# Patient Record
Sex: Male | Born: 1945 | Race: White | Hispanic: No | Marital: Married | Smoking: Never smoker
Health system: Southern US, Community
[De-identification: ages and names within clinical notes are randomized; demographics above are authoritative.]

## PROBLEM LIST (undated history)

## (undated) DIAGNOSIS — K439 Ventral hernia without obstruction or gangrene: Secondary | ICD-10-CM

## (undated) DIAGNOSIS — Z952 Presence of prosthetic heart valve: Secondary | ICD-10-CM

## (undated) DIAGNOSIS — M109 Gout, unspecified: Secondary | ICD-10-CM

## (undated) DIAGNOSIS — Z951 Presence of aortocoronary bypass graft: Secondary | ICD-10-CM

## (undated) DIAGNOSIS — H269 Unspecified cataract: Secondary | ICD-10-CM

## (undated) DIAGNOSIS — K219 Gastro-esophageal reflux disease without esophagitis: Secondary | ICD-10-CM

## (undated) DIAGNOSIS — I10 Essential (primary) hypertension: Secondary | ICD-10-CM

## (undated) DIAGNOSIS — M1990 Unspecified osteoarthritis, unspecified site: Secondary | ICD-10-CM

## (undated) DIAGNOSIS — L570 Actinic keratosis: Secondary | ICD-10-CM

## (undated) DIAGNOSIS — E785 Hyperlipidemia, unspecified: Secondary | ICD-10-CM

## (undated) DIAGNOSIS — I519 Heart disease, unspecified: Secondary | ICD-10-CM

## (undated) DIAGNOSIS — I4891 Unspecified atrial fibrillation: Secondary | ICD-10-CM

## (undated) DIAGNOSIS — I509 Heart failure, unspecified: Secondary | ICD-10-CM

## (undated) HISTORY — DX: Presence of prosthetic heart valve: Z95.2

## (undated) HISTORY — DX: Heart disease, unspecified: I51.9

## (undated) HISTORY — DX: Essential (primary) hypertension: I10

## (undated) HISTORY — DX: Presence of aortocoronary bypass graft: Z95.1

## (undated) HISTORY — DX: Actinic keratosis: L57.0

## (undated) HISTORY — PX: VALVE REPLACEMENT: SUR13

## (undated) HISTORY — PX: HERNIA REPAIR: SHX51

## (undated) HISTORY — DX: Hyperlipidemia, unspecified: E78.5

---

## 2015-12-12 ENCOUNTER — Encounter: Payer: Self-pay | Admitting: Dietician

## 2015-12-12 ENCOUNTER — Encounter: Payer: Medicare Other | Attending: Family Medicine | Admitting: Dietician

## 2015-12-12 VITALS — BP 120/72 | Ht 72.0 in | Wt 202.7 lb

## 2015-12-12 DIAGNOSIS — E119 Type 2 diabetes mellitus without complications: Secondary | ICD-10-CM | POA: Diagnosis present

## 2015-12-12 DIAGNOSIS — Z713 Dietary counseling and surveillance: Secondary | ICD-10-CM | POA: Diagnosis present

## 2015-12-12 NOTE — Progress Notes (Signed)
Diabetes Self-Management Education  Visit Type: First/Initial  Appt. Start Time: 0900 Appt. End Time: 1010  12/12/2015  [name], identified by name and date of birth, is a 70 y.o. male with a diagnosis of Diabetes: Type 2.   ASSESSMENT  Blood pressure 120/72, height 6' (1.829 m), weight 202 lb 11.2 oz (91.9 kg). Body mass index is 27.49 kg/m. Lacks knowledge of diabetes care and healthy diet     Diabetes Self-Management Education - 12/12/15 1115      Visit Information   Visit Type First/Initial     Initial Visit   Diabetes Type Type 2     Health Coping   How would you rate your overall health? Good     Psychosocial Assessment   Patient Belief/Attitude about Diabetes Motivated to manage diabetes   Self-care barriers None   Patient Concerns Monitoring;Medication;Glycemic Control;Weight Control;Healthy Lifestyle  prevent complications; become more fit; learn more about healthy diet   Special Needs None   Preferred Learning Style Auditory   Learning Readiness Ready     Pre-Education Assessment   Patient understands the diabetes disease and treatment process. Needs Instruction   Patient understands incorporating nutritional management into lifestyle. Needs Instruction   Patient undertands incorporating physical activity into lifestyle. Needs Instruction  exercise limited due to history of CABGx1 and aortic valve replacement   Patient understands using medications safely. Needs Instruction   Patient understands monitoring blood glucose, interpreting and using results Needs Instruction   Patient understands prevention, detection, and treatment of acute complications. Needs Instruction   Patient understands prevention, detection, and treatment of chronic complications. Needs Instruction   Patient understands how to develop strategies to address psychosocial issues. Needs Instruction   Patient understands how to develop strategies to promote health/change behavior.  Needs Instruction     Complications   Last HgB A1C per patient/outside source 6.7 %  11-14-15   How often do you check your blood sugar? 0 times/day (not testing)   Have you had a dilated eye exam in the past 12 months? No  2 years ago   Have you had a dental exam in the past 12 months? No  1 year ago   Are you checking your feet? No     Dietary Intake   Breakfast --  meal times vary-eats breakfast 6a-10a=egg or oatmeal   Snack (morning) --  eats occasional snack 10-11a=crackers or peanut butter Louis Cooper sandwich   Lunch --  occasionally skips lunch; eats 12-2p if does eat lunch=does not eat fried foods or sweets/desserts   Snack (afternoon) --  eats occasional snakc of crackers or peanut butter/jelly sandwich   Dinner --  eats supper at 5-7p=recently has cut back on pasta and carbs   Snack (evening) --  eats p-nut butter/jelly sandwich at 9-10p   Beverage(s) --  drinks fruit juice 2x/day, water 2-3x/day. decaf coffee with sugar 3-4x/day     Exercise   Exercise Type ADL's-lifts weights 30+ min 2-3x/wk plans to start [place] exercise program soon     Patient Education   Previous Diabetes Education No   Disease state  Explored patient's options for treatment of their diabetes;Definition of diabetes, type 1 and 2, and the diagnosis of diabetes   Nutrition management  Role of diet in the treatment of diabetes and the relationship between the three main macronutrients and blood glucose level;Food label reading, portion sizes and measuring food.;Carbohydrate counting   Physical activity and exercise  Role of exercise on diabetes management,  blood pressure control and cardiac health.  advised to get permission from MD before beginning any exercise program   Monitoring Taught/evaluated SMBG meter.;Purpose and frequency of SMBG.;Taught/discussed recording of test results and interpretation of SMBG.;Identified appropriate SMBG and/or A1C goals.;Yearly dilated eye exam  gave pt One Touch  Verio Flex meter and instructed on its use-BG 144 (2 hrpp)   Chronic complications Relationship between chronic complications and blood glucose control;Retinopathy and reason for yearly dilated eye exams;Dental care   Personal strategies to promote health Lifestyle issues that need to be addressed for better diabetes care;Helped patient develop diabetes management plan for (enter comment)      Individualized Plan for Diabetes Self-Management Training:   Learning Objective:  Patient will have a greater understanding of diabetes self-management. Patient education plan is to attend individual and/or group sessions per assessed needs and concerns.   Plan:   Patient Instructions   Check blood sugars 2 x day before breakfast and 2 hrs after supper every day and record  Exercise:  Radiation protection practitioner -get MD permission  Avoid sugar sweetened drinks (soda, tea, coffee, sports drinks, juices)  Eat 3 meals day,   1 snacks a day at bedtime  Space meals 4-6 hours apart  Eat 3-4 carbohydrate servings/meal + protein  Eat 1 carbohydrate serving/snack + protein  Make dentist / eye doctor appointments  Bring blood sugar records to the next appointment/class  Call your doctor for a prescription for:  1. Meter strips (type)  Ultra One Touch Verio test strips  checking 2 times per day  2. Lancets (type)  One Touch Delica lancets  checking 2  times per day  Get a Sharps container  Return for appointment/classes on: 01-01-16   Expected Outcomes:   positive  Education material provided: general meal planning guidelines, Ultra One Touch Verio Flex meter  If problems or questions, patient to contact team via: [phone]  Future DSME appointment:  01-01-16

## 2015-12-12 NOTE — Patient Instructions (Signed)
  Check blood sugars 2 x day before breakfast and 2 hrs after supper every day and record  Exercise:  Radiation protection practitioner -get MD permission  Avoid sugar sweetened drinks (soda, tea, coffee, sports drinks, juices)  Eat 3 meals day,   1 snacks a day at bedtime  Space meals 4-6 hours apart  Eat 3-4 carbohydrate servings/meal + protein  Eat 1 carbohydrate serving/snack + protein  Make dentist / eye doctor appointments  Bring blood sugar records to the next appointment/class  Call your doctor for a prescription for:  1. Meter strips (type)  Ultra One Touch Verio test strips  checking 2 times per day  2. Lancets (type)  One Touch Delica lancets  checking 2  times per day  Get a Sharps container  Return for appointment/classes on: 01-01-16

## 2015-12-15 ENCOUNTER — Telehealth: Payer: Self-pay | Admitting: *Deleted

## 2015-12-15 NOTE — Telephone Encounter (Signed)
Patient left voice mail that he was given a One Touch Verio meter when he met with Lenell Antu, RN earlier this week. His physician wrote for One Touch Mini meter and he requested assistance using it. I left a message on his voice mail to call back.

## 2016-01-01 ENCOUNTER — Encounter: Payer: Self-pay | Admitting: Dietician

## 2016-01-01 ENCOUNTER — Encounter: Payer: Medicare Other | Attending: Family Medicine | Admitting: Dietician

## 2016-01-01 VITALS — Ht 72.0 in | Wt 200.8 lb

## 2016-01-01 DIAGNOSIS — Z713 Dietary counseling and surveillance: Secondary | ICD-10-CM | POA: Insufficient documentation

## 2016-01-01 DIAGNOSIS — E119 Type 2 diabetes mellitus without complications: Secondary | ICD-10-CM | POA: Diagnosis present

## 2016-01-01 NOTE — Progress Notes (Signed)

## 2016-01-08 ENCOUNTER — Encounter: Payer: Self-pay | Admitting: Dietician

## 2016-01-08 ENCOUNTER — Encounter: Payer: Medicare Other | Admitting: Dietician

## 2016-01-08 VITALS — Wt 200.0 lb

## 2016-01-08 DIAGNOSIS — Z713 Dietary counseling and surveillance: Secondary | ICD-10-CM | POA: Diagnosis not present

## 2016-01-08 DIAGNOSIS — E119 Type 2 diabetes mellitus without complications: Secondary | ICD-10-CM

## 2016-01-08 NOTE — Progress Notes (Signed)

## 2016-03-11 ENCOUNTER — Encounter: Payer: Self-pay | Admitting: Dietician

## 2016-03-11 ENCOUNTER — Encounter: Payer: Medicare Other | Attending: Family Medicine | Admitting: Dietician

## 2016-03-11 VITALS — BP 130/80 | Ht 72.0 in | Wt 198.5 lb

## 2016-03-11 DIAGNOSIS — E119 Type 2 diabetes mellitus without complications: Secondary | ICD-10-CM | POA: Diagnosis present

## 2016-03-11 DIAGNOSIS — Z713 Dietary counseling and surveillance: Secondary | ICD-10-CM | POA: Insufficient documentation

## 2016-03-11 NOTE — Progress Notes (Signed)

## 2016-03-20 ENCOUNTER — Encounter: Payer: Self-pay | Admitting: *Deleted

## 2016-03-20 NOTE — Progress Notes (Signed)
Chart review.

## 2016-04-02 ENCOUNTER — Other Ambulatory Visit: Payer: Self-pay

## 2016-04-02 ENCOUNTER — Ambulatory Visit
Admission: RE | Admit: 2016-04-02 | Discharge: 2016-04-02 | Disposition: A | Discharge status: Home / Self Care | Payer: Medicare Other | Source: Ambulatory Visit | Attending: Internal Medicine | Admitting: Internal Medicine

## 2016-04-02 ENCOUNTER — Encounter: Payer: Self-pay | Admitting: *Deleted

## 2016-04-02 ENCOUNTER — Encounter: Payer: Self-pay | Admitting: Dietician

## 2016-04-02 ENCOUNTER — Encounter
Admission: RE | Disposition: A | Discharge status: Home / Self Care | Payer: Self-pay | Source: Ambulatory Visit | Attending: Internal Medicine

## 2016-04-02 ENCOUNTER — Ambulatory Visit: Payer: Medicare Other | Admitting: Anesthesiology

## 2016-04-02 DIAGNOSIS — Z7982 Long term (current) use of aspirin: Secondary | ICD-10-CM | POA: Diagnosis not present

## 2016-04-02 DIAGNOSIS — I110 Hypertensive heart disease with heart failure: Secondary | ICD-10-CM | POA: Diagnosis not present

## 2016-04-02 DIAGNOSIS — I509 Heart failure, unspecified: Secondary | ICD-10-CM | POA: Diagnosis not present

## 2016-04-02 DIAGNOSIS — I480 Paroxysmal atrial fibrillation: Secondary | ICD-10-CM | POA: Diagnosis not present

## 2016-04-02 DIAGNOSIS — E785 Hyperlipidemia, unspecified: Secondary | ICD-10-CM | POA: Insufficient documentation

## 2016-04-02 DIAGNOSIS — Z7901 Long term (current) use of anticoagulants: Secondary | ICD-10-CM | POA: Diagnosis not present

## 2016-04-02 DIAGNOSIS — Z952 Presence of prosthetic heart valve: Secondary | ICD-10-CM | POA: Insufficient documentation

## 2016-04-02 DIAGNOSIS — Z951 Presence of aortocoronary bypass graft: Secondary | ICD-10-CM | POA: Insufficient documentation

## 2016-04-02 DIAGNOSIS — I4891 Unspecified atrial fibrillation: Secondary | ICD-10-CM | POA: Diagnosis present

## 2016-04-02 DIAGNOSIS — Z79899 Other long term (current) drug therapy: Secondary | ICD-10-CM | POA: Diagnosis not present

## 2016-04-02 HISTORY — DX: Gastro-esophageal reflux disease without esophagitis: K21.9

## 2016-04-02 HISTORY — PX: TEE WITHOUT CARDIOVERSION: SHX5443

## 2016-04-02 HISTORY — PX: CARDIOVERSION: EP1203

## 2016-04-02 HISTORY — DX: Unspecified osteoarthritis, unspecified site: M19.90

## 2016-04-02 HISTORY — DX: Unspecified cataract: H26.9

## 2016-04-02 HISTORY — DX: Gout, unspecified: M10.9

## 2016-04-02 HISTORY — DX: Unspecified atrial fibrillation: I48.91

## 2016-04-02 HISTORY — DX: Heart failure, unspecified: I50.9

## 2016-04-02 HISTORY — DX: Ventral hernia without obstruction or gangrene: K43.9

## 2016-04-02 LAB — PROTIME-INR
INR: 1.96
PROTHROMBIN TIME: 22.6 s — AB (ref 11.4–15.2)

## 2016-04-02 SURGERY — ECHOCARDIOGRAM, TRANSESOPHAGEAL
Anesthesia: General

## 2016-04-02 MED ORDER — MIDAZOLAM HCL 2 MG/2ML IJ SOLN
INTRAMUSCULAR | Status: DC | PRN
  Administered 2016-04-02: 1.5 mg via INTRAVENOUS

## 2016-04-02 MED ORDER — SODIUM CHLORIDE 0.9 % IV SOLN: INTRAVENOUS | Status: DC

## 2016-04-02 MED ORDER — PROPOFOL 10 MG/ML IV BOLUS
INTRAVENOUS | Status: DC | PRN
  Administered 2016-04-02 (×2): 10 mg via INTRAVENOUS
  Administered 2016-04-02: 20 mg via INTRAVENOUS
  Administered 2016-04-02: 10 mg via INTRAVENOUS
  Administered 2016-04-02: 20 mg via INTRAVENOUS
  Administered 2016-04-02: 10 mg via INTRAVENOUS
  Administered 2016-04-02 (×4): 20 mg via INTRAVENOUS
  Administered 2016-04-02: 10 mg via INTRAVENOUS
  Administered 2016-04-02: 20 mg via INTRAVENOUS

## 2016-04-02 MED ORDER — SODIUM CHLORIDE FLUSH 0.9 % IV SOLN
INTRAVENOUS | Status: AC
  Filled 2016-04-02: qty 10

## 2016-04-02 MED ORDER — MIDAZOLAM HCL 2 MG/2ML IJ SOLN
INTRAMUSCULAR | Status: AC
  Filled 2016-04-02: qty 2

## 2016-04-02 MED ORDER — LIDOCAINE VISCOUS 2 % MT SOLN
OROMUCOSAL | Status: AC
  Filled 2016-04-02: qty 15

## 2016-04-02 MED ORDER — ONDANSETRON HCL 4 MG/2ML IJ SOLN: 4.0000 mg | Freq: Once | INTRAMUSCULAR | Status: DC | PRN

## 2016-04-02 MED ORDER — PROPOFOL 10 MG/ML IV BOLUS
INTRAVENOUS | Status: AC
  Filled 2016-04-02: qty 40

## 2016-04-02 MED ORDER — BUTAMBEN-TETRACAINE-BENZOCAINE 2-2-14 % EX AERO
INHALATION_SPRAY | CUTANEOUS | Status: AC
  Filled 2016-04-02: qty 20

## 2016-04-02 NOTE — CV Procedure (Signed)
Electrical Cardioversion Procedure Note Louis Cooper [id number] 1945-06-25  Procedure: Electrical Cardioversion Indications:  Atrial Fibrillation  Procedure Details Consent: Risks of procedure as well as the alternatives and risks of each were explained to the (patient/caregiver).  Consent for procedure obtained. Time Out: Verified patient identification, verified procedure, site/side was marked, verified correct patient position, special equipment/implants available, medications/allergies/relevent history reviewed, required imaging and test results available.  Performed  Patient placed on cardiac monitor, pulse oximetry, supplemental oxygen as necessary.  Sedation given: Benzodiazepines and Short-acting barbiturates Pacer pads placed anterior and posterior chest.  Cardioverted 1 time(s).  Cardioverted at 120J.  Evaluation Findings: Post procedure EKG shows: NSR Complications: None Patient did tolerate procedure well.   Corey Skains 04/02/2016, 7:41 AM

## 2016-04-02 NOTE — Transfer of Care (Signed)
Immediate Anesthesia Transfer of Care Note  Patient: Louis Cooper  Procedure(s) Performed: Procedure(s): TRANSESOPHAGEAL ECHOCARDIOGRAM (TEE) (N/A) CARDIOVERSION (N/A)  Patient Location: PACU  Anesthesia Type:General  Level of Consciousness: awake and patient cooperative  Airway & Oxygen Therapy: Patient Spontanous Breathing and Patient connected to nasal cannula oxygen  Post-op Assessment: Report given to RN and Post -op Vital signs reviewed and stable  Post vital signs: Reviewed and stable  Last Vitals:  Vitals:   04/02/16 0643 04/02/16 0744  BP: 135/85 99/69  Pulse: 91 73  Resp: 17   Temp: 36.6 C     Last Pain:  Vitals:   04/02/16 0643  TempSrc: Oral         Complications: No apparent anesthesia complications

## 2016-04-02 NOTE — Anesthesia Post-op Follow-up Note (Cosign Needed)
Anesthesia QCDR form completed.        

## 2016-04-02 NOTE — Anesthesia Preprocedure Evaluation (Signed)
Anesthesia Evaluation  Patient identified by MRN, date of birth, ID band Patient awake    Reviewed: Allergy & Precautions, H&P , NPO status , Patient's Chart, lab work & pertinent test results, reviewed documented beta blocker date and time   Airway Mallampati: II   Neck ROM: full    Dental  (+) Poor Dentition, Teeth Intact   Pulmonary neg pulmonary ROS,    Pulmonary exam normal        Cardiovascular hypertension, On Medications +CHF  negative cardio ROS Normal cardiovascular exam+ dysrhythmias Atrial Fibrillation  Rhythm:regular Rate:Normal     Neuro/Psych negative neurological ROS  negative psych ROS   GI/Hepatic negative GI ROS, Neg liver ROS, GERD  Medicated,  Endo/Other  negative endocrine ROS  Renal/GU negative Renal ROS  negative genitourinary   Musculoskeletal   Abdominal   Peds  Hematology negative hematology ROS (+)   Anesthesia Other Findings Past Medical History: No date: A-fib ([place]) No date: Arthritis No date: Cataract No date: CHF (congestive heart failure) (HCC) No date: GERD (gastroesophageal reflux disease) No date: Gout No date: Heart disease No date: Hernia of abdominal wall No date: History of heart valve replacement 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 No date: Hyperlipidemia No date: Hyperlipidemia No date: Hypertension Past Surgical History: No date: HERNIA REPAIR No date: VALVE REPLACEMENT BMI    Body Mass Index:  27.34 kg/m     Reproductive/Obstetrics negative OB ROS                             Anesthesia Physical Anesthesia Plan  ASA: III  Anesthesia Plan: General   Post-op Pain Management:    Induction:   Airway Management Planned:   Additional Equipment:   Intra-op Plan:   Post-operative Plan:   Informed Consent: I have reviewed the patients History and Physical, chart, labs and discussed the procedure including the risks, benefits and alternatives  for the proposed anesthesia with the patient or authorized representative who has indicated his/her understanding and acceptance.   Dental Advisory Given  Plan Discussed with: CRNA  Anesthesia Plan Comments:         Anesthesia Quick Evaluation

## 2016-04-02 NOTE — Progress Notes (Signed)
*  PRELIMINARY RESULTS* Echocardiogram Echocardiogram Transesophageal has been performed.  Sherrie Sport 04/02/2016, 8:19 AM

## 2016-04-03 ENCOUNTER — Encounter: Payer: Self-pay | Admitting: Internal Medicine

## 2016-04-04 NOTE — Anesthesia Postprocedure Evaluation (Signed)
Anesthesia Post Note  Patient: Verne [place]  Procedure(s) Performed: Procedure(s) (LRB): TRANSESOPHAGEAL ECHOCARDIOGRAM (TEE) (N/A) CARDIOVERSION (N/A)  Patient location during evaluation: PACU Anesthesia Type: General Level of consciousness: awake and alert Pain management: pain level controlled Vital Signs Assessment: post-procedure vital signs reviewed and stable Respiratory status: spontaneous breathing, nonlabored ventilation, respiratory function stable and patient connected to nasal cannula oxygen Cardiovascular status: blood pressure returned to baseline and stable Postop Assessment: no signs of nausea or vomiting Anesthetic complications: no     Last Vitals:  Vitals:   04/02/16 0830 04/02/16 0845  BP: 120/77 128/77  Pulse: 68 72  Resp: 16 12  Temp:      Last Pain:  Vitals:   04/02/16 0643  TempSrc: Oral                 Molli Barrows

## 2016-04-25 ENCOUNTER — Encounter: Payer: Self-pay | Admitting: Intensive Care

## 2016-04-25 ENCOUNTER — Observation Stay
Admission: EM | Admit: 2016-04-25 | Discharge: 2016-04-26 | Disposition: A | Discharge status: Home / Self Care | Payer: Medicare Other | Attending: Internal Medicine | Admitting: Internal Medicine

## 2016-04-25 ENCOUNTER — Other Ambulatory Visit: Payer: Self-pay

## 2016-04-25 ENCOUNTER — Emergency Department: Payer: Medicare Other

## 2016-04-25 DIAGNOSIS — Z79899 Other long term (current) drug therapy: Secondary | ICD-10-CM | POA: Insufficient documentation

## 2016-04-25 DIAGNOSIS — I509 Heart failure, unspecified: Secondary | ICD-10-CM | POA: Diagnosis not present

## 2016-04-25 DIAGNOSIS — Z7901 Long term (current) use of anticoagulants: Secondary | ICD-10-CM | POA: Diagnosis not present

## 2016-04-25 DIAGNOSIS — I441 Atrioventricular block, second degree: Principal | ICD-10-CM | POA: Insufficient documentation

## 2016-04-25 DIAGNOSIS — Z952 Presence of prosthetic heart valve: Secondary | ICD-10-CM | POA: Diagnosis not present

## 2016-04-25 DIAGNOSIS — Z951 Presence of aortocoronary bypass graft: Secondary | ICD-10-CM | POA: Insufficient documentation

## 2016-04-25 DIAGNOSIS — R55 Syncope and collapse: Secondary | ICD-10-CM | POA: Diagnosis present

## 2016-04-25 DIAGNOSIS — I482 Chronic atrial fibrillation: Secondary | ICD-10-CM | POA: Insufficient documentation

## 2016-04-25 DIAGNOSIS — I110 Hypertensive heart disease with heart failure: Secondary | ICD-10-CM | POA: Insufficient documentation

## 2016-04-25 DIAGNOSIS — I480 Paroxysmal atrial fibrillation: Secondary | ICD-10-CM | POA: Insufficient documentation

## 2016-04-25 DIAGNOSIS — I25810 Atherosclerosis of coronary artery bypass graft(s) without angina pectoris: Secondary | ICD-10-CM | POA: Diagnosis not present

## 2016-04-25 DIAGNOSIS — Z7982 Long term (current) use of aspirin: Secondary | ICD-10-CM | POA: Insufficient documentation

## 2016-04-25 DIAGNOSIS — E785 Hyperlipidemia, unspecified: Secondary | ICD-10-CM | POA: Insufficient documentation

## 2016-04-25 DIAGNOSIS — I459 Conduction disorder, unspecified: Secondary | ICD-10-CM | POA: Diagnosis present

## 2016-04-25 LAB — URINALYSIS, COMPLETE (UACMP) WITH MICROSCOPIC
BACTERIA UA: NONE SEEN
BILIRUBIN URINE: NEGATIVE
Glucose, UA: NEGATIVE mg/dL
HGB URINE DIPSTICK: NEGATIVE
Ketones, ur: NEGATIVE mg/dL
LEUKOCYTES UA: NEGATIVE
Nitrite: NEGATIVE
Protein, ur: NEGATIVE mg/dL
Specific Gravity, Urine: 1.013 (ref 1.005–1.030)
pH: 6 (ref 5.0–8.0)

## 2016-04-25 LAB — CBC
HCT: 38 % — ABNORMAL LOW (ref 40.0–52.0)
HEMOGLOBIN: 13.2 g/dL (ref 13.0–18.0)
MCH: 29.6 pg (ref 26.0–34.0)
MCHC: 34.7 g/dL (ref 32.0–36.0)
MCV: 85.5 fL (ref 80.0–100.0)
Platelets: 156 10*3/uL (ref 150–440)
RBC: 4.45 MIL/uL (ref 4.40–5.90)
RDW: 13.7 % (ref 11.5–14.5)
WBC: 4.8 10*3/uL (ref 3.8–10.6)

## 2016-04-25 LAB — BASIC METABOLIC PANEL
ANION GAP: 7 (ref 5–15)
BUN: 18 mg/dL (ref 6–20)
CHLORIDE: 107 mmol/L (ref 101–111)
CO2: 25 mmol/L (ref 22–32)
CREATININE: 0.8 mg/dL (ref 0.61–1.24)
Calcium: 9 mg/dL (ref 8.9–10.3)
GFR calc non Af Amer: >60 mL/min (ref >60)
Glucose, Bld: 117 mg/dL — ABNORMAL HIGH (ref 65–99)
POTASSIUM: 3.8 mmol/L (ref 3.5–5.1)
Sodium: 139 mmol/L (ref 135–145)

## 2016-04-25 LAB — PROTIME-INR
INR: 1.23
PROTHROMBIN TIME: 15.6 s — AB (ref 11.4–15.2)

## 2016-04-25 LAB — APTT: aPTT: 39 seconds — ABNORMAL HIGH (ref 24–36)

## 2016-04-25 MED ORDER — WARFARIN SODIUM 5 MG PO TABS: 5.0000 mg | ORAL_TABLET | Freq: Every evening | ORAL | Status: DC

## 2016-04-25 MED ORDER — TAMSULOSIN HCL 0.4 MG PO CAPS
0.4000 mg | ORAL_CAPSULE | Freq: Every day | ORAL | Status: DC
  Administered 2016-04-25: 0.4 mg via ORAL
  Filled 2016-04-25: qty 1

## 2016-04-25 MED ORDER — ACETAMINOPHEN 325 MG PO TABS: 650.0000 mg | ORAL_TABLET | Freq: Four times a day (QID) | ORAL | Status: DC | PRN

## 2016-04-25 MED ORDER — COENZYME Q10 100 MG PO CAPS: 1.0000 | ORAL_CAPSULE | Freq: Every day | ORAL | Status: DC

## 2016-04-25 MED ORDER — ATORVASTATIN CALCIUM 20 MG PO TABS
40.0000 mg | ORAL_TABLET | Freq: Every day | ORAL | Status: DC
  Administered 2016-04-25: 40 mg via ORAL
  Filled 2016-04-25: qty 2

## 2016-04-25 MED ORDER — WARFARIN SODIUM 3 MG PO TABS
6.0000 mg | ORAL_TABLET | Freq: Once | ORAL | Status: AC
  Administered 2016-04-25: 6 mg via ORAL
  Filled 2016-04-25: qty 1
  Filled 2016-04-25: qty 2

## 2016-04-25 MED ORDER — SODIUM CHLORIDE 0.9 % IV BOLUS (SEPSIS)
1000.0000 mL | Freq: Once | INTRAVENOUS | Status: AC
  Administered 2016-04-25: 1000 mL via INTRAVENOUS

## 2016-04-25 MED ORDER — ONDANSETRON HCL 4 MG PO TABS: 4.0000 mg | ORAL_TABLET | Freq: Four times a day (QID) | ORAL | Status: DC | PRN

## 2016-04-25 MED ORDER — WARFARIN - PHARMACIST DOSING INPATIENT
Freq: Every day | Status: DC
  Administered 2016-04-25: 1
  Filled 2016-04-25 (×7): qty 1

## 2016-04-25 MED ORDER — VITAMIN D 1000 UNITS PO TABS
1000.0000 [IU] | ORAL_TABLET | Freq: Every day | ORAL | Status: DC
  Administered 2016-04-25 – 2016-04-26 (×2): 1000 [IU] via ORAL
  Filled 2016-04-25 (×4): qty 1

## 2016-04-25 MED ORDER — RENA-VITE PO TABS
1.0000 | ORAL_TABLET | Freq: Every day | ORAL | Status: DC
  Administered 2016-04-25 – 2016-04-26 (×2): 1 via ORAL
  Filled 2016-04-25 (×2): qty 1

## 2016-04-25 MED ORDER — MAGNESIUM OXIDE 400 (241.3 MG) MG PO TABS
400.0000 mg | ORAL_TABLET | Freq: Every day | ORAL | Status: DC
  Administered 2016-04-25 – 2016-04-26 (×2): 400 mg via ORAL
  Filled 2016-04-25 (×2): qty 1

## 2016-04-25 MED ORDER — ONDANSETRON HCL 4 MG/2ML IJ SOLN: 4.0000 mg | Freq: Four times a day (QID) | INTRAMUSCULAR | Status: DC | PRN

## 2016-04-25 MED ORDER — ASPIRIN EC 81 MG PO TBEC
81.0000 mg | DELAYED_RELEASE_TABLET | Freq: Every day | ORAL | Status: DC
  Administered 2016-04-25 – 2016-04-26 (×2): 81 mg via ORAL
  Filled 2016-04-25 (×2): qty 1

## 2016-04-25 MED ORDER — ACETAMINOPHEN 650 MG RE SUPP: 650.0000 mg | Freq: Four times a day (QID) | RECTAL | Status: DC | PRN

## 2016-04-25 NOTE — H&P (Signed)
[place] [facility] [place] NAME: Louis Cooper    MR#:  [id number]  DATE OF BIRTH:  [birth year]  DATE OF ADMISSION:  04/25/2016  PRIMARY CARE PHYSICIAN: Maryland Pink, MD   REQUESTING/REFERRING PHYSICIAN: Dr Mable Paris  CHIEF COMPLAINT:  Dizziness and not feeling well near-syncopal episode  HISTORY OF PRESENT ILLNESS:  Louis Cooper  is a 71 y.o. male with a known history of Severe aortic stenosis status post aortic valve replacement, coronary artery disease status post CABG, history of A. fib on oral anticoagulation comes in the emergency room after he was going home from chiropractor office for his patient's wife's appointment started having dizziness and not feeling well. He had a near-syncopal episode while he was in the car. He came to the emergency room was found to be bradycardic initially with possible  second-degree Mobitz 2. Patient takes multach and beta blockers which will be held.  Patient's heart rate during my evaluation is in the 70s. He is asymptomatic. Presently. Denies any chest pain or shortness of breath  PAST MEDICAL HISTORY:   Past Medical History:  Diagnosis Date  . A-fib ([place])   . Arthritis   . Cataract   . CHF (congestive heart failure) ([place])   . GERD (gastroesophageal reflux disease)   . Gout   . Heart disease   . Hernia of abdominal wall   . History of heart valve replacement   . Hx of CABG   . Hyperlipidemia   . Hyperlipidemia   . Hypertension     PAST SURGICAL HISTOIRY:   Past Surgical History:  Procedure Laterality Date  . CARDIOVERSION N/A 04/02/2016   Procedure: CARDIOVERSION;  Surgeon: Corey Skains, MD;  Location: [facility] ORS;  Service: Cardiovascular;  Laterality: N/A;  . HERNIA REPAIR    . TEE WITHOUT CARDIOVERSION N/A 04/02/2016   Procedure: TRANSESOPHAGEAL ECHOCARDIOGRAM (TEE);  Surgeon: Corey Skains, MD;  Location: [facility] ORS;  Service: Cardiovascular;  Laterality: N/A;  . VALVE  REPLACEMENT      SOCIAL HISTORY:   Social History  Substance Use Topics  . Smoking status: Never Smoker  . Smokeless tobacco: Never Used  . Alcohol use No    FAMILY HISTORY:  History reviewed. No pertinent family history.  DRUG ALLERGIES:  No Known Allergies  REVIEW OF SYSTEMS:  Review of Systems  Constitutional: Negative for chills, fever and weight loss.  HENT: Negative for ear discharge, ear pain and nosebleeds.   Eyes: Negative for blurred vision, pain and discharge.  Respiratory: Negative for sputum production, shortness of breath, wheezing and stridor.   Cardiovascular: Negative for chest pain, palpitations, orthopnea and PND.  Gastrointestinal: Negative for abdominal pain, diarrhea, nausea and vomiting.  Genitourinary: Negative for frequency and urgency.  Musculoskeletal: Negative for back pain and joint pain.  Neurological: Positive for dizziness and weakness. Negative for sensory change, speech change and focal weakness.  Psychiatric/Behavioral: Negative for depression and hallucinations. The patient is not nervous/anxious.      MEDICATIONS AT HOME:   Prior to Admission medications   Medication Sig Start Date End Date Taking? Authorizing Provider  atorvastatin (LIPITOR) 40 MG tablet Take 1 tablet by mouth daily. 09/25/15  Yes Historical Provider, MD  B Complex Vitamins (VITAMIN B COMPLEX) TABS Take 1 tablet by mouth daily.   Yes Historical Provider, MD  cetaphil (CETAPHIL) cream Apply 1 application topically as needed (moisture).   Yes Historical Provider, MD  cholecalciferol (VITAMIN D) 1000 units tablet Take 1,000  Units by mouth daily.    Yes Historical Provider, MD  Coenzyme Q10 100 MG capsule Take 1 capsule by mouth daily.   Yes Historical Provider, MD  Magnesium 400 MG TABS Take 1 tablet by mouth daily.   Yes Historical Provider, MD  tamsulosin (FLOMAX) 0.4 MG CAPS capsule Take 0.4 mg by mouth daily.    Yes Historical Provider, MD  TOPROL XL 50 MG 24 hr tablet  Take 50 mg by mouth daily. 04/10/16  Yes Historical Provider, MD  triamcinolone cream (KENALOG) 0.1 % Apply 1 application topically daily as needed (rash).   Yes Historical Provider, MD  warfarin (COUMADIN) 5 MG tablet Take 5 mg by mouth every evening. 03/19/16  Yes Historical Provider, MD  aspirin EC 81 MG tablet Take 1 tablet by mouth daily.    Historical Provider, MD      VITAL SIGNS:  Blood pressure (!) 168/82, pulse 67, temperature 97.5 F (36.4 C), temperature source Oral, resp. rate 11, height 6' (1.829 m), weight 86.2 kg (190 lb), SpO2 96 %.  PHYSICAL EXAMINATION:  GENERAL:  71 y.o.-year-old patient lying in the bed with no acute distress.  EYES: Pupils equal, round, reactive to light and accommodation. No scleral icterus. Extraocular muscles intact.  HEENT: Head atraumatic, normocephalic. Oropharynx and nasopharynx clear.  NECK:  Supple, no jugular venous distention. No thyroid enlargement, no tenderness.  LUNGS: Normal breath sounds bilaterally, no wheezing, rales,rhonchi or crepitation. No use of accessory muscles of respiration.  CARDIOVASCULAR: S1, S2 normal.systolic  Murmurs in the aortic area ++,no  rubs, or gallops.  ABDOMEN: Soft, nontender, nondistended. Bowel sounds present. No organomegaly or mass.  EXTREMITIES: No pedal edema, cyanosis, or clubbing.  NEUROLOGIC: Cranial nerves II through XII are intact. Muscle strength 5/5 in all extremities. Sensation intact. Gait not checked.  PSYCHIATRIC: The patient is alert and oriented x 3.  SKIN: No obvious rash, lesion, or ulcer.   LABORATORY PANEL:   CBC  Recent Labs Lab 04/25/16 1200  WBC 4.8  HGB 13.2  HCT 38.0*  PLT 156   ------------------------------------------------------------------------------------------------------------------  Chemistries   Recent Labs Lab 04/25/16 1200  NA 139  K 3.8  CL 107  CO2 25  GLUCOSE 117*  BUN 18  CREATININE 0.80  CALCIUM 9.0    ------------------------------------------------------------------------------------------------------------------  Cardiac Enzymes No results for input(s): TROPONINI in the last 168 hours. ------------------------------------------------------------------------------------------------------------------  RADIOLOGY:  Dg Chest Port 1 View  Result Date: 04/25/2016 CLINICAL DATA:  Dizziness after chiropractor visit. History of hypertension, hyperlipidemia, CHF. EXAM: PORTABLE CHEST 1 VIEW COMPARISON:  None. FINDINGS: Cardiac silhouette is mild-to-moderately enlarged. Status post CABG. Increased lung volumes with mild chronic interstitial changes, no pleural effusion or focal consolidation. Apical pleural thickening. No pneumothorax. Osteopenia. Soft tissue planes are normal. IMPRESSION: Mild to moderate cardiomegaly.  COPD. Electronically Signed   By: Elon Alas M.D.   On: 04/25/2016 14:14    EKG:    IMPRESSION AND PLAN:   Louis Cooper  is a 71 y.o. male with a known history of Severe aortic stenosis status post aortic valve replacement, coronary artery disease status post CABG, history of A. fib on oral anticoagulation comes in the emergency room after he was going home from chiropractor office for his patient's wife's appointment started having dizziness and not feeling well. He had a near-syncopal episode while he was in the car. He came to the emergency room was found to be bradycardic initially with possible  second-degree Mobitz 2.   1. Symptomatic bradycardia/near syncope/possible  AV block second-degree Mobitz type II -Admit to telemetry -Hold rate blocking agents -Cardiology consultation. Patient follows with Dr. Nehemiah Massed -Cycle cardiac enzymes -Electrolytes appear okay  2. History of chronic A. Fib -On oral anticoagulation pharmacy to dose warfarin  3. Coronary artery disease status post CABG -Continue cardiac meds except rate blocking agents  4. Hyperlipidemia  continue statins  5. History of aortic stenosis status post aortic valve replacement tissue valve  6. DVT prophylaxis already on warfarin          All the records are reviewed and case discussed with ED provider. Management plans discussed with the patient, family and they are in agreement.  CODE STATUS: FULL  TOTAL TIME TAKING CARE OF THIS PATIENT: 50 minutes.    Purva Vessell M.D on 04/25/2016 at 3:34 PM  Between 7am to 6pm - Pager - [phone]  After 6pm go to [email or web address] - password [email or web address] [facility]  [facility]  Office  [phone]  CC: Primary care physician; Maryland Pink, MD

## 2016-04-25 NOTE — Consult Note (Signed)
[facility] Consultation Note  Patient ID: Louis Cooper, MRN: [id number], DOB/AGE: [birth year] 71 y.o. Admit date: 04/25/2016   Date of Consult: 04/25/2016 Primary Physician: [place] Pink, MD Primary [place]  Chief Complaint:  Chief Complaint  Patient presents with  . Near Syncope   Reason for Consult: syncope  HPI: 71 y.o. male with known coronary artery disease status post coronary artery bypass grafting in the remote past as well as severe aortic valve stenosis status post aortic valve replacement who has had recent paroxysmal nonvalvular atrial fibrillation. The patient has been placed on appropriate medication in recent past for this and has cardioverted into the normal heart rhythm. This was done by using multitaq and beta blocker. After using these medication combination the patient had significant bradycardia weakness fatigue and shortness of breath. Dosages of metoprolol were decreased as well as dosages of multiecho was discontinued. He remained and normal sinus rhythm over the last several weeks and has done fairly well but still is somewhat sluggish. Today he had some presyncopal episode for which she had some diaphoresis and weakness and almost passed out. When seen in the emergency room he had sinus bradycardia and what appeared to the emergency room physician as the second degree block. These strips have not been shown or seen from the emergency room and therefore cannot be confirmed. He otherwise has felt fairly well and had no evidence of chest pain or other shortness of breath. Current EKG shows sinus bradycardia  Past Medical History:  Diagnosis Date  . A-fib ([place])   . Arthritis   . Cataract   . CHF (congestive heart failure) ([place])   . GERD (gastroesophageal reflux disease)   . Gout   . Heart disease   . Hernia of abdominal wall   . History of heart valve replacement   . Hx of CABG   . Hyperlipidemia   . Hyperlipidemia   . Hypertension        Surgical History:  Past Surgical History:  Procedure Laterality Date  . CARDIOVERSION N/A 04/02/2016   Procedure: CARDIOVERSION;  Surgeon: Corey Skains, MD;  Location: [facility] ORS;  Service: Cardiovascular;  Laterality: N/A;  . HERNIA REPAIR    . TEE WITHOUT CARDIOVERSION N/A 04/02/2016   Procedure: TRANSESOPHAGEAL ECHOCARDIOGRAM (TEE);  Surgeon: Corey Skains, MD;  Location: [facility] ORS;  Service: Cardiovascular;  Laterality: N/A;  . VALVE REPLACEMENT       Home Meds: Prior to Admission medications   Medication Sig Start Date End Date Taking? Authorizing Provider  atorvastatin (LIPITOR) 40 MG tablet Take 1 tablet by mouth daily. 09/25/15  Yes Historical Provider, MD  B Complex Vitamins (VITAMIN B COMPLEX) TABS Take 1 tablet by mouth daily.   Yes Historical Provider, MD  cetaphil (CETAPHIL) cream Apply 1 application topically as needed (moisture).   Yes Historical Provider, MD  cholecalciferol (VITAMIN D) 1000 units tablet Take 1,000 Units by mouth daily.    Yes Historical Provider, MD  Coenzyme Q10 100 MG capsule Take 1 capsule by mouth daily.   Yes Historical Provider, MD  Magnesium 400 MG TABS Take 1 tablet by mouth daily.   Yes Historical Provider, MD  tamsulosin (FLOMAX) 0.4 MG CAPS capsule Take 0.4 mg by mouth daily.    Yes Historical Provider, MD  TOPROL XL 50 MG 24 hr tablet Take 50 mg by mouth daily. 04/10/16  Yes Historical Provider, MD  triamcinolone cream (KENALOG) 0.1 % Apply 1 application topically daily as needed (rash).  Yes Historical Provider, MD  warfarin (COUMADIN) 5 MG tablet Take 5 mg by mouth every evening. 03/19/16  Yes Historical Provider, MD  aspirin EC 81 MG tablet Take 1 tablet by mouth daily.    Historical Provider, MD    Inpatient Medications:  . aspirin EC  81 mg Oral Daily  . atorvastatin  40 mg Oral Daily  . cholecalciferol  1,000 Units Oral Daily  . magnesium oxide  400 mg Oral Daily  . multivitamin  1 tablet Oral Daily  . tamsulosin  0.4 mg Oral  Daily  . Warfarin - Pharmacist Dosing Inpatient   Does not apply q1800     Allergies: No Known Allergies  Social History   Social History  . Marital status: Married    Spouse name: N/A  . Number of children: N/A  . Years of education: N/A   Occupational History  . Not on file.   Social History Main Topics  . Smoking status: Never Smoker  . Smokeless tobacco: Never Used  . Alcohol use No  . Drug use: Unknown  . Sexual activity: Not on file   Other Topics Concern  . Not on file   Social History Narrative  . No narrative on file     History reviewed. No pertinent family history.   Review of Systems Positive forSyncope weakness fatigue Negative for: General:  chills, fever, night sweats or weight changes.  Cardiovascular: PND orthopnea positive for syncope dizziness  Dermatological skin lesions rashes Respiratory: Cough congestion Urologic: Frequent urination urination at night and hematuria Abdominal: negative for nausea, vomiting, diarrhea, bright red blood per rectum, melena, or hematemesis Neurologic: negative for visual changes, and/or hearing changes  All other systems reviewed and are otherwise negative except as noted above.  Labs: No results for input(s): CKTOTAL, CKMB, TROPONINI in the last 72 hours. Lab Results  Component Value Date   WBC 4.8 04/25/2016   HGB 13.2 04/25/2016   HCT 38.0 (L) 04/25/2016   MCV 85.5 04/25/2016   PLT 156 04/25/2016    Recent Labs Lab 04/25/16 1200  NA 139  K 3.8  CL 107  CO2 25  BUN 18  CREATININE 0.80  CALCIUM 9.0  GLUCOSE 117*   No results found for: CHOL, HDL, LDLCALC, TRIG No results found for: DDIMER  Radiology/Studies:  Dg Chest Port 1 View  Result Date: 04/25/2016 CLINICAL DATA:  Dizziness after chiropractor visit. History of hypertension, hyperlipidemia, CHF. EXAM: PORTABLE CHEST 1 VIEW COMPARISON:  None. FINDINGS: Cardiac silhouette is mild-to-moderately enlarged. Status post CABG. Increased lung  volumes with mild chronic interstitial changes, no pleural effusion or focal consolidation. Apical pleural thickening. No pneumothorax. Osteopenia. Soft tissue planes are normal. IMPRESSION: Mild to moderate cardiomegaly.  COPD. Electronically Signed   By: Elon Alas M.D.   On: 04/25/2016 14:14    WPY:KDXIP bradycardia  Weights: Filed Weights   04/25/16 1152  Weight: 86.2 kg (190 lb)     Physical Exam: Blood pressure (!) 141/64, pulse 70, temperature 97.8 F (36.6 C), temperature source Oral, resp. rate 19, height 6' (1.829 m), weight 86.2 kg (190 lb), SpO2 97 %. Body mass index is 25.77 kg/m. General: Well developed, well nourished, in no acute distress. Head eyes ears nose throat: Normocephalic, atraumatic, sclera non-icteric, no xanthomas, nares are without discharge. No apparent thyromegaly and/or mass  Lungs: Normal respiratory effort.  no wheezes, no rales, no rhonchi.  Heart: RRR with normal S1 Soft S2. 3+ aortic murmur gallop, no rub, PMI is  normal size and placement, carotid upstroke normal without bruit, jugular venous pressure is normal Abdomen: Soft, non-tender, non-distended with normoactive bowel sounds. No hepatomegaly. No rebound/guarding. No obvious abdominal masses. Abdominal aorta is normal size without bruit Extremities: No edema. no cyanosis, no clubbing, no ulcers  Peripheral : 2+ bilateral upper extremity pulses, 2+ bilateral femoral pulses, 2+ bilateral dorsal pedal pulse Neuro: Alert and oriented. No facial asymmetry. No focal deficit. Moves all extremities spontaneously. Musculoskeletal: Normal muscle tone without kyphosis Psych:  Responds to questions appropriately with a normal affect.    Assessment: 71 year old male with known coronary artery disease status post coronary artery bypass graft aortic valve stenosis status post replacement and paroxysmal nonvalvular atrial fibrillation with an episode of syncope presyncope likely secondary to sinus  bradycardia and/or symptomatic bradycardia. There is no current evidence of heart failure or myocardial infarction  Plan: 1. Discontinuation of metoprolol to improve heart rate and concerns of syncope 2. Follow closely for any rhythm disturbances or other changes consistent with advanced heart block 3. Ambulate and follow for any further significant symptoms or issues concerning for heart block 4. Continue anticoagulation with a goal INR between 2-3 to reduce the risk of episode of the stroke risk with atrial fibrillation 5. Possible discharged home tomorrow if doing fairly well with no evidence of further heart block or symptomatic bradycardia  Signed, Corey Skains M.D. [facility] 04/25/2016, 5:45 PM

## 2016-04-25 NOTE — Progress Notes (Signed)

## 2016-04-25 NOTE — ED Notes (Signed)
Family at bedside. No needs. Wife brought pt food, okayed to eat by doctor.

## 2016-04-25 NOTE — Care Management Obs Status (Signed)
[facility] NOTIFICATION   Patient Details  Name: Masen Luallen MRN: [id number] Date of Birth: May 19, 1945   Medicare Observation Status Notification Given:  Yes    CrutchfieldAntony Haste, RN 04/25/2016, 3:09 PM

## 2016-04-25 NOTE — Progress Notes (Signed)
ANTICOAGULATION CONSULT NOTE - Initial Consult  Pharmacy Consult for Warfarin Indication: atrial fibrillation  No Known Allergies  Patient Measurements: Height: 6' (182.9 cm) Weight: 190 lb (86.2 kg) IBW/kg (Calculated) : 77.6 Heparin Dosing Weight:    Vital Signs: Temp: 97.5 F (36.4 C) (03/08 1151) Temp Source: Oral (03/08 1151) BP: 168/82 (03/08 1430) Pulse Rate: 67 (03/08 1430)  Labs:  Recent Labs  04/25/16 1200  HGB 13.2  HCT 38.0*  PLT 156  APTT 39*  LABPROT 15.6*  INR 1.23  CREATININE 0.80    Estimated Creatinine Clearance: 94.3 mL/min (by C-G formula based on SCr of 0.8 mg/dL).   Medical History: Past Medical History:  Diagnosis Date  . A-fib ([place])   . Arthritis   . Cataract   . CHF (congestive heart failure) ([place])   . GERD (gastroesophageal reflux disease)   . Gout   . Heart disease   . Hernia of abdominal wall   . History of heart valve replacement   . Hx of CABG   . Hyperlipidemia   . Hyperlipidemia   . Hypertension     Assessment: 71 yo M on Warfarin at home for Atrial Fibrillation.  Patient on Warfarin 5 mg po daily PTA with last dose per patient on 04/24/16.  3/8  INR 1.23    Goal of Therapy:  INR 2-3 Monitor platelets by anticoagulation protocol: Yes   Plan:  INR subtherapeutic. Will order Warfarin 6 mg po x 1 tonight. Will f/u INR with am labs.  Martine Bleecker A 04/25/2016,3:13 PM

## 2016-04-25 NOTE — ED Provider Notes (Signed)
[facility] Emergency Department Provider Note  ____________________________________________   First MD Initiated Contact with Patient 04/25/16 1309     (approximate)  I have reviewed the triage vital signs and the nursing notes.   HISTORY  Chief Complaint Near Syncope    HPI Louis Cooper is a 71 y.o. male who comes to the emergency department after a near syncopal episode. He was in the car this afternoon when suddenly he began to feel nauseated and lightheaded and felt like everything was closing in and he nearly passed out in the car. He denies vertigo. He does have a past medical history of atrial fibrillation and recently was ablated. He is currently only taking 50 mg of long-acting metoprolol today. He's had no chest pain or shortness of breath.No abdominal pain. No vomiting. No double vision or blurred vision. No headache.  04/10/16 Cards note:  71 y.o. male with  1. Atrial fibrillation, unspecified type (CMS-HCC)  2. Coronary artery disease involving coronary bypass graft of native heart without angina pectoris  3. Essential hypertension  4. Paroxysmal A-fib (CMS-HCC)  5. Severe aortic valve stenosis  6. Aortic valve prosthesis present  7. Hyperlipidemia, unspecified hyperlipidemia type  8. Coronary artery disease involving coronary bypass graft of native heart without angina pectoris        Past Medical History:  Diagnosis Date  . A-fib ([place])   . Arthritis   . Cataract   . CHF (congestive heart failure) ([place])   . GERD (gastroesophageal reflux disease)   . Gout   . Heart disease   . Hernia of abdominal wall   . History of heart valve replacement   . Hx of CABG   . Hyperlipidemia   . Hyperlipidemia   . Hypertension     Patient Active Problem List   Diagnosis Date Noted  . 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 CARDIOVERSION N/A 04/02/2016   Procedure: CARDIOVERSION;  Surgeon:  Corey Skains, MD;  Location: [facility] ORS;  Service: Cardiovascular;  Laterality: N/A;  . HERNIA REPAIR    . TEE WITHOUT CARDIOVERSION N/A 04/02/2016   Procedure: TRANSESOPHAGEAL ECHOCARDIOGRAM (TEE);  Surgeon: Corey Skains, MD;  Location: [facility] ORS;  Service: Cardiovascular;  Laterality: N/A;  . VALVE REPLACEMENT      Prior to Admission medications   Medication Sig Start Date End Date Taking? Authorizing Provider  atorvastatin (LIPITOR) 40 MG tablet Take 1 tablet by mouth daily. 09/25/15  Yes Historical Provider, MD  B Complex Vitamins (VITAMIN B COMPLEX) TABS Take 1 tablet by mouth daily.   Yes Historical Provider, MD  cetaphil (CETAPHIL) cream Apply 1 application topically as needed (moisture).   Yes Historical Provider, MD  cholecalciferol (VITAMIN D) 1000 units tablet Take 1,000 Units by mouth daily.    Yes Historical Provider, MD  Coenzyme Q10 100 MG capsule Take 1 capsule by mouth daily.   Yes Historical Provider, MD  Magnesium 400 MG TABS Take 1 tablet by mouth daily.   Yes Historical Provider, MD  tamsulosin (FLOMAX) 0.4 MG CAPS capsule Take 0.4 mg by mouth daily.    Yes Historical Provider, MD  triamcinolone cream (KENALOG) 0.1 % Apply 1 application topically daily as needed (rash).   Yes Historical Provider, MD  aspirin EC 81 MG tablet Take 1 tablet by mouth daily.    Historical Provider, MD  warfarin (COUMADIN) 6 MG tablet Take 1 tablet (6 mg total) by mouth daily. 04/26/16  Epifanio Lesches, MD    Allergies Patient has no known allergies.  History reviewed. No pertinent family history.  Social History Social History  Substance Use Topics  . Smoking status: Never Smoker  . Smokeless tobacco: Never Used  . Alcohol use No    Review of Systems Constitutional: No fever/chills Eyes: No visual changes. ENT: No sore throat. Cardiovascular: Denies chest pain. Respiratory: Denies shortness of breath. Gastrointestinal: No abdominal pain.  Positive nausea, no vomiting.  No  diarrhea.  No constipation. Genitourinary: Negative for dysuria. Musculoskeletal: Negative for back pain. Skin: Negative for rash. Neurological: Negative for headaches, focal weakness or numbness.  10-point ROS otherwise negative.  ____________________________________________   PHYSICAL EXAM:  VITAL SIGNS: ED Triage Vitals  Enc Vitals Group     BP 04/25/16 1151 (!) 160/82     Pulse Rate 04/25/16 1151 60     Resp 04/25/16 1151 (!) 7     Temp 04/25/16 1151 97.5 F (36.4 C)     Temp Source 04/25/16 1151 Oral     SpO2 04/25/16 1151 97 %     Weight 04/25/16 1152 190 lb (86.2 kg)     Height 04/25/16 1152 6' (1.829 m)     Head Circumference --      Peak Flow --      Pain Score --      Pain Loc --      Pain Edu? --      Excl. in [place]? --     Constitutional: Alert and oriented x 4 well appearing nontoxic no diaphoresis speaks in full, clear sentences Eyes: PERRL EOMI. Head: Atraumatic. Nose: No congestion/rhinnorhea. Mouth/Throat: No trismus Neck: No stridor.   Cardiovascular: Normal rate, regular rhythm. Grossly normal heart sounds.  Good peripheral circulation. Respiratory: Normal respiratory effort.  No retractions. Lungs CTAB and moving good air Gastrointestinal: Soft nondistended nontender no rebound no guarding no peritonitis no McBurney's tenderness negative Rovsing's no costovertebral tenderness negative Murphy's Musculoskeletal: No lower extremity edema   Neurologic:  Normal speech and language. No gross focal neurologic deficits are appreciated. Skin:  Skin is warm, dry and intact. No rash noted. Psychiatric: Mood and affect are normal. Speech and behavior are normal.  ____________________________________________   LABS (all labs ordered are listed, but only abnormal results are displayed)  Labs Reviewed  BASIC METABOLIC PANEL - Abnormal; Notable for the following:       Result Value   Glucose, Bld 117 (*)    All other components within normal limits  CBC -  Abnormal; Notable for the following:    HCT 38.0 (*)    All other components within normal limits  URINALYSIS, COMPLETE (UACMP) WITH MICROSCOPIC - Abnormal; Notable for the following:    Color, Urine YELLOW (*)    APPearance CLEAR (*)    Squamous Epithelial / LPF 0-5 (*)    All other components within normal limits  PROTIME-INR - Abnormal; Notable for the following:    Prothrombin Time 15.6 (*)    All other components within normal limits  APTT - Abnormal; Notable for the following:    aPTT 39 (*)    All other components within normal limits  PROTIME-INR - Abnormal; Notable for the following:    Prothrombin Time 16.3 (*)    All other components within normal limits   ____________________________________________  EKG  ED ECG REPORT I, Darel Hong, the attending physician, personally viewed and interpreted this ECG.  Date: 04/26/2016 Rate: 69 Rhythm: normal sinus rhythm QRS Axis:  normal Intervals: normal ST/T Wave abnormalities: normal Conduction Disturbances: none Narrative Interpretation: unremarkable  ____________________________________________  RADIOLOGY  Chest x-ray with no acute disease ____________________________________________   PROCEDURES  Procedure(s) performed: no  Procedures  Critical Care performed: no  ____________________________________________   INITIAL IMPRESSION / ASSESSMENT AND PLAN / ED COURSE  Pertinent labs & imaging results that were available during my care of the patient were reviewed by me and considered in my medical decision making (see chart for details).       ----------------------------------------- 1:44 PM on 04/25/2016 -----------------------------------------  While I was interviewing the patient he had a brief episode where he became bradycardic to the high 30s/low 40s. On review of this strip it appears that he had a run of second degree type II heart  block. ____________________________________________  ----------------------------------------- 2:28 PM on 04/25/2016 -----------------------------------------  I discussed the case with the patient's cardiologist Dr. Nehemiah Massed who recommended stopping the patient's metoprolol and he will see him in the morning.   FINAL CLINICAL IMPRESSION(S) / ED DIAGNOSES  Final diagnoses:  Second degree heart block      NEW MEDICATIONS STARTED DURING THIS VISIT:  Discharge Medication List as of 04/26/2016  9:37 AM       Note:  This document was prepared using Dragon voice recognition software and may include unintentional dictation errors.     Darel Hong, MD 04/26/16 [id number]
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
## 2016-04-26 DIAGNOSIS — I441 Atrioventricular block, second degree: Secondary | ICD-10-CM | POA: Diagnosis not present

## 2016-04-26 LAB — PROTIME-INR
INR: 1.3
Prothrombin Time: 16.3 seconds — ABNORMAL HIGH (ref 11.4–15.2)

## 2016-04-26 MED ORDER — WARFARIN SODIUM 6 MG PO TABS: 6.0000 mg | ORAL_TABLET | Freq: Every day | ORAL | 0 refills | Status: DC

## 2016-04-26 NOTE — Progress Notes (Signed)
Patient discharged via wheelchair and private vehicle. IV removed and catheter intact. All discharge instructions given and patient verbalizes understanding. Tele removed and returned. No prescriptions given to patient No distress noted.   

## 2016-04-26 NOTE — Discharge Summary (Signed)
Louis Cooper, is a 71 y.o. male  DOB October 10, 1945  MRN [id number].  Admission date:  04/25/2016  Admitting Physician  Fritzi Mandes, MD  Discharge Date:  04/26/2016   Primary MD  Maryland Pink, MD  Recommendations for primary care physician for things to follow:   Follow up with Dr.Kowalski in one week.   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 A-fib ([place])   . Arthritis   . Cataract   . CHF (congestive heart failure) ([place])   . GERD (gastroesophageal reflux disease)   . Gout   . Heart disease   . Hernia of abdominal wall   . History of heart valve replacement   . Hx of CABG   . Hyperlipidemia   . Hyperlipidemia   . Hypertension     Past Surgical History:  Procedure Laterality Date  . CARDIOVERSION N/A 04/02/2016   Procedure: CARDIOVERSION;  Surgeon: Corey Skains, MD;  Location: [facility] ORS;  Service: Cardiovascular;  Laterality: N/A;  . HERNIA REPAIR    . TEE WITHOUT CARDIOVERSION N/A 04/02/2016   Procedure: TRANSESOPHAGEAL ECHOCARDIOGRAM (TEE);  Surgeon: Corey Skains, MD;  Location: [facility] ORS;  Service: Cardiovascular;  Laterality: N/A;  . VALVE REPLACEMENT         History of present illness and  Hospital Course:     Kindly see H&P for history of present illness and admission details, please review complete Labs, Consult reports and Test reports for all details in brief  HPI  from the history and physical done on the day of admission  71 yr old male  With CADs/p CABG ,admitted for syncope and bradycardia.  Hospital Course   71 y.o. male with the known coronary artery disease status post coronary bypass graft and aortic valve replacement with  episodes of paroxysmal nonvalvular atrial fibrillation with rapid ventricular rate on appropriate medication management to having an episode of syncope likely secondary to symptomatic bradycardia without evidence of the advanced heart block by telemetry or EKG at this time and no current evidence of angina or myocardial infarction 1. Abstain from metoprolol at this time due to symptomatic bradycardia and presyncope 2. Continue warfarin for further risk reduction in stroke with atrial fibrillation,INR less than 2,d/w pt,adjusted coumadin dose, 3. High intensity cholesterol therapy with atorvastatin without change 5. No further cardiac diagnostics necessary at this time,follow with cardio in one week d/w family    Discharge Condition: stable   Follow UP  Follow-up Information    Maryland Pink, MD. Go on 05/01/2016.   Specialty:  Family Medicine Why:  Appointment Time: 11:00am Contact information: [address] [place] [address] [phone]             Discharge Instructions  and  Discharge Medications     Allergies as of 04/26/2016   No Known Allergies     Medication List    STOP taking these medications   TOPROL XL 50 MG 24 hr tablet Generic drug:  metoprolol succinate     TAKE these medications   aspirin EC 81 MG tablet Take 1 tablet by mouth daily.   atorvastatin 40 MG tablet Commonly known as:  LIPITOR Take 1 tablet by mouth daily.   cetaphil cream Apply 1 application topically as needed (moisture).   cholecalciferol  1000 units tablet Commonly known as:  VITAMIN D Take 1,000 Units by mouth daily.   Coenzyme Q10 100 MG capsule Take 1 capsule by mouth daily.   Magnesium 400 MG Tabs Take 1 tablet by mouth daily.   tamsulosin 0.4 MG Caps capsule Commonly known as:  FLOMAX Take 0.4 mg by mouth daily.   triamcinolone cream 0.1 % Commonly known as:  KENALOG Apply 1 application topically daily as needed (rash).   Vitamin B  Complex Tabs Take 1 tablet by mouth daily.   warfarin 6 MG tablet Commonly known as:  COUMADIN Take 1 tablet (6 mg total) by mouth daily. What changed:  medication strength  how much to take  when to take this         Diet and Activity recommendation: See Discharge Instructions above   Consults obtained -cardiology   Major procedures and Radiology Reports - PLEASE review detailed and final reports for all details, in brief -     Dg Chest Port 1 View  Result Date: 04/25/2016 CLINICAL DATA:  Dizziness after chiropractor visit. History of hypertension, hyperlipidemia, CHF. EXAM: PORTABLE CHEST 1 VIEW COMPARISON:  None. FINDINGS: Cardiac silhouette is mild-to-moderately enlarged. Status post CABG. Increased lung volumes with mild chronic interstitial changes, no pleural effusion or focal consolidation. Apical pleural thickening. No pneumothorax. Osteopenia. Soft tissue planes are normal. IMPRESSION: Mild to moderate cardiomegaly.  COPD. Electronically Signed   By: Elon Alas M.D.   On: 04/25/2016 14:14    Micro Results    No results found for this or any previous visit (from the past 240 hour(s)).     Today   Subjective:   Louis Cooper today has no headache,no chest abdominal pain,no new weakness tingling or numbness, feels much better wants to go home today.  Objective:   Blood pressure 126/73, pulse 60, temperature 97.7 F (36.5 C), temperature source Oral, resp. rate 16, height 6' (1.829 m), weight 86.2 kg (190 lb), SpO2 96 %.   Intake/Output Summary (Last 24 hours) at 04/26/16 1833 Last data filed at 04/26/16 0330  Gross per 24 hour  Intake              480 ml  Output                0 ml  Net              480 ml    Exam Awake Alert, Oriented x 3, No new F.N deficits, Normal affect [place].AT,PERRAL Supple Neck,No JVD, No cervical lymphadenopathy appriciated.  Symmetrical Chest wall movement, Good air movement bilaterally, CTAB RRR,No  Gallops,Rubs or new Murmurs, No Parasternal Heave +ve B.Sounds, Abd Soft, Non tender, No organomegaly appriciated, No rebound -guarding or rigidity. No Cyanosis, Clubbing or edema, No new Rash or bruise  Data Review   CBC w Diff: Lab Results  Component Value Date   WBC 4.8 04/25/2016   HGB 13.2 04/25/2016   HCT 38.0 (L) 04/25/2016   PLT 156 04/25/2016    CMP: Lab Results  Component Value Date   NA 139 04/25/2016   K 3.8 04/25/2016   CL 107 04/25/2016   CO2 25 04/25/2016   BUN 18 04/25/2016   CREATININE 0.80 04/25/2016  .   Total Time in preparing paper work, data evaluation and todays exam - 39 minutes  Dalyah Pla M.D on 04/26/2016 at 6:33 PM    Note: This dictation was prepared with Dragon dictation along with smaller phrase technology. Any transcriptional errors  that result from this process are unintentional.

## 2016-04-26 NOTE — Progress Notes (Signed)
[facility] Encounter Note  Patient: Louis Cooper / Admit Date: 04/25/2016 / Date of Encounter: 04/26/2016, 6:52 AM   Subjective: No new symptoms overnight. No evidence of advanced heart block. Some bradycardia but slowly improving after discontinuation of metoprolol. No evidence of syncope.  Review of Systems: Positive for: None Negative for: Vision change, hearing change, syncope, dizziness, nausea, vomiting,diarrhea, bloody stool, stomach pain, cough, congestion, diaphoresis, urinary frequency, urinary pain,skin lesions, skin rashes Others previously listed  Objective: Telemetry: Sinus bradycardia Physical Exam: Blood pressure 124/78, pulse 71, temperature 98.2 F (36.8 C), temperature source Oral, resp. rate 18, height 6' (1.829 m), weight 86.2 kg (190 lb), SpO2 97 %. Body mass index is 25.77 kg/m. General: Well developed, well nourished, in no acute distress. Head: Normocephalic, atraumatic, sclera non-icteric, no xanthomas, nares are without discharge. Neck: No apparent masses Lungs: Normal respirations with no wheezes, no rhonchi, no rales , no crackles   Heart: Regular rate and rhythm, normal S1 soft S2, 3+ aortic murmur, no rub, no gallop, PMI is normal size and placement, carotid upstroke normal with  bruit, jugular venous pressure normal Abdomen: Soft, non-tender, non-distended with normoactive bowel sounds. No hepatosplenomegaly. Abdominal aorta is normal size without bruit Extremities: No edema, no clubbing, no cyanosis, no ulcers,  Peripheral: 2+ radial, 2+ femoral, 2+ dorsal pedal pulses Neuro: Alert and oriented. Moves all extremities spontaneously. Psych:  Responds to questions appropriately with a normal affect.   Intake/Output Summary (Last 24 hours) at 04/26/16 0652 Last data filed at 04/26/16 0330  Gross per 24 hour  Intake              480 ml  Output                0 ml  Net              480 ml    Inpatient Medications:  . aspirin  EC  81 mg Oral Daily  . atorvastatin  40 mg Oral Daily  . cholecalciferol  1,000 Units Oral Daily  . magnesium oxide  400 mg Oral Daily  . multivitamin  1 tablet Oral Daily  . tamsulosin  0.4 mg Oral Daily  . Warfarin - Pharmacist Dosing Inpatient   Does not apply q1800   Infusions:   Labs:  Recent Labs  04/25/16 1200  NA 139  K 3.8  CL 107  CO2 25  GLUCOSE 117*  BUN 18  CREATININE 0.80  CALCIUM 9.0   No results for input(s): AST, ALT, ALKPHOS, BILITOT, PROT, ALBUMIN in the last 72 hours.  Recent Labs  04/25/16 1200  WBC 4.8  HGB 13.2  HCT 38.0*  MCV 85.5  PLT 156   No results for input(s): CKTOTAL, CKMB, TROPONINI in the last 72 hours. Invalid input(s): POCBNP No results for input(s): HGBA1C in the last 72 hours.   Weights: Filed Weights   04/25/16 1152  Weight: 86.2 kg (190 lb)     Radiology/Studies:  Dg Chest Port 1 View  Result Date: 04/25/2016 CLINICAL DATA:  Dizziness after chiropractor visit. History of hypertension, hyperlipidemia, CHF. EXAM: PORTABLE CHEST 1 VIEW COMPARISON:  None. FINDINGS: Cardiac silhouette is mild-to-moderately enlarged. Status post CABG. Increased lung volumes with mild chronic interstitial changes, no pleural effusion or focal consolidation. Apical pleural thickening. No pneumothorax. Osteopenia. Soft tissue planes are normal. IMPRESSION: Mild to moderate cardiomegaly.  COPD. Electronically Signed   By: Elon Alas M.D.   On: 04/25/2016 14:14  Assessment and Recommendation  71 y.o. male with the known coronary artery disease status post coronary bypass graft and aortic valve replacement with episodes of paroxysmal nonvalvular atrial fibrillation with rapid ventricular rate on appropriate medication management to having an episode of syncope likely secondary to symptomatic bradycardia without evidence of the advanced heart block by telemetry or EKG at this time and no current evidence of angina or myocardial infarction 1.  Abstain from metoprolol at this time due to symptomatic bradycardia and presyncope 2. Continue warfarin for further risk reduction in stroke with atrial fibrillation 3. High intensity cholesterol therapy with atorvastatin without change 4. Begin ambulation following for episodes of atrial fibrillation and/or concerns of continued symptomatic bradycardia. If no further significant symptoms or advanced heart block patient would be okay for discharge to home with follow-up next week to make further adjustments of medication management 5. No further cardiac diagnostics necessary at this time  Signed, Serafina Royals M.D. FACC

## 2016-04-26 NOTE — Plan of Care (Signed)
Problem: Safety: Goal: Ability to remain free from injury will improve Remained free from falls/injury. Does not score to have bed alarm on, non skid socks in place, encouraged to call for help if needed  Problem: Pain Managment: Goal: General experience of comfort will improve Outcome: Completed/Met Date Met: 04/26/16 No complaints of pain this shift  Problem: Tissue Perfusion: Goal: Risk factors for ineffective tissue perfusion will decrease Outcome: Progressing On coumadin & aspirin.

## 2016-04-26 NOTE — Discharge Instructions (Signed)
Near-Syncope °Near-syncope is when you suddenly get weak or dizzy, or you feel like you might pass out (faint). During an episode of near-syncope, you may: °· Feel dizzy or light-headed. °· Feel sick to your stomach (nauseous). °· See all white or all black. °· Have cold, clammy skin. ° °If you passed out, get help right away.Call your local emergency services (911 in the [place] Do not drive yourself to the hospital. °Follow these instructions at home: °Pay attention to any changes in your symptoms. Take these actions to help with your condition: °· Have someone stay with you until you feel stable. °· Do not drive, use machinery, or play sports until your doctor says it is okay. °· Keep all follow-up visits as told by your doctor. This is important. °· If you start to feel like you might pass out, lie down right away and raise (elevate) your feet above the level of your heart. Breathe deeply and steadily. Wait until all of the symptoms are gone. °· Drink enough fluid to keep your pee (urine) clear or pale yellow. °· If you are taking blood pressure or heart medicine, get up slowly and spend many minutes getting ready to sit and then stand. This can help with dizziness. °· Take over-the-counter and prescription medicines only as told by your doctor. ° °Get help right away if: °· You have a very bad headache. °· You have unusual pain in your chest, tummy, or back. °· You are bleeding from your mouth or rectum. °· You have black or tarry poop (stool). °· You have a very fast or uneven heartbeat (palpitations). °· You pass out one time or more than once. °· You have jerky movements that you cannot control (seizure). °· You are confused. °· You have trouble walking. °· You are very weak. °· You have vision problems. °These symptoms may be an emergency. Do not wait to see if the symptoms will go away. Get medical help right away. Call your local emergency services (911 in the [place] Do not drive yourself to the  hospital. °This information is not intended to replace advice given to you by your health care provider. Make sure you discuss any questions you have with your health care provider. °Document Released: 07/24/2007 Document Revised: 07/13/2015 Document Reviewed: 10/19/2014 °Elsevier Interactive Patient Education © 2017 Elsevier Inc. ° °

## 2016-07-02 ENCOUNTER — Encounter: Payer: Self-pay | Admitting: Physical Therapy

## 2016-07-02 ENCOUNTER — Ambulatory Visit: Payer: Medicare Other | Attending: Student | Admitting: Physical Therapy

## 2016-07-02 DIAGNOSIS — M6281 Muscle weakness (generalized): Secondary | ICD-10-CM | POA: Insufficient documentation

## 2016-07-02 DIAGNOSIS — M542 Cervicalgia: Secondary | ICD-10-CM | POA: Diagnosis not present

## 2016-07-02 NOTE — Patient Instructions (Addendum)
High Row: Standing    Face anchor, feet shoulder width apart. Palms down, pull arms back, squeezing shoulder blades together. Repeat _15_ times per set. Do __ 2sets per session. Do _7_ sessions per week. Anchor Height: Chest  http://tub.exer.us/63   Copyright  VHI. All rights reserved.  Axial Extension (Chin Tuck)    Pull chin in and lengthen back of neck. Hold ____ seconds while counting out loud. Repeat __15__ times. Do ___2_ sessions per day.  http://gt2.exer.us/449   Copyright  VHI. All rights reserved.

## 2016-07-02 NOTE — Therapy (Signed)
[place] MAIN [facility] SERVICES [address] [place], [place], [address] Phone: [phone]   Fax:  [phone]  Physical Therapy Evaluation  Patient Details  Name: Louis Cooper MRN: [id number] Date of Birth: 30-Mar-1945 Referring Provider: Lattie Corns   Encounter Date: 07/02/2016      PT End of Session - 07/02/16 1040    Visit Number 1   Number of Visits 17   Date for PT Re-Evaluation 08-31-2016   Authorization Type g codes medicare   PT Start Time 1032   PT Stop Time 1130   PT Time Calculation (min) 58 min   Activity Tolerance Patient tolerated treatment well;Patient limited by pain      Past Medical History:  Diagnosis Date  . A-fib ([place])   . Arthritis   . Cataract   . CHF (congestive heart failure) ([place])   . GERD (gastroesophageal reflux disease)   . Gout   . Heart disease   . Hernia of abdominal wall   . History of heart valve replacement   . Hx of CABG   . Hyperlipidemia   . Hyperlipidemia   . Hypertension     Past Surgical History:  Procedure Laterality Date  . CARDIOVERSION N/A 04/02/2016   Procedure: CARDIOVERSION;  Surgeon: Corey Skains, MD;  Location: [facility] ORS;  Service: Cardiovascular;  Laterality: N/A;  . HERNIA REPAIR    . TEE WITHOUT CARDIOVERSION N/A 04/02/2016   Procedure: TRANSESOPHAGEAL ECHOCARDIOGRAM (TEE);  Surgeon: Corey Skains, MD;  Location: [facility] ORS;  Service: Cardiovascular;  Laterality: N/A;  . VALVE REPLACEMENT      There were no vitals filed for this visit.       Subjective Assessment - 07/02/16 1042    Subjective Patient has neck pain and stiffness and he is taking muscle relaxer medication and steriods that did decrease his pain for several weeks. He began with neck pain  3-4 months ago after throwing the football with the grand kids. He feels that his pain is getting better but at night he feels pain in his neck.    Pertinent History Patient reports neck pain that began 3-  4 months ago. He was the MD and was given muscle relaxor medication and steroid medicine that helped some but he is still having pain. The tightness is constant and increased pain comes if he is in a bumpy car ride or right after he performs exercises. He has constant pain that ranges from 2/10 to 6/10. Patient had a heart valve replaced and  his chest was  opened up and his chest feels like it is a steel plate and is tight.    Limitations Reading   How long can you sit comfortably? no limitation   How long can you stand comfortably? no limitation   How long can you walk comfortably? no limitations   Diagnostic tests x ray   Patient Stated Goals to have less pain   Currently in Pain? Yes   Pain Score 5    Pain Location Neck   Pain Orientation Left   Pain Descriptors / Indicators Discomfort   Pain Type Chronic pain   Pain Radiating Towards left arm   Pain Onset More than a month ago   Pain Frequency Constant   Aggravating Factors  reading   Pain Relieving Factors advil   Effect of Pain on Daily Activities unable to play foot ball with grand kids   Multiple Pain Sites No  [facility] PT Assessment - 07/02/16 0001      Assessment   Medical Diagnosis Spondylosis of cervical region    Referring Provider Damaris Hippo [facility]    Onset Date/Surgical Date 06/27/16   Hand Dominance Right   Next MD Visit 07/14/16   Prior Therapy no     Precautions   Precautions None;Other (comment)     Restrictions   Weight Bearing Restrictions No     Balance Screen   Has the patient fallen in the past 6 months No   Has the patient had a decrease in activity level because of a fear of falling?  Yes   Is the patient reluctant to leave their home because of a fear of falling?  No     Home Ecologist residence   Living Arrangements Spouse/significant other   Available Help at Discharge Family   Type of [place] to enter   Entrance  Stairs-Number of Steps 2   Entrance Stairs-Rails Right   [place] One level   [place] None     Prior Function   Level of [place] Retired       PAIN: 2/10 - 6/10 neck and L side of neck   Palpation: Patient has + spring test T4, T5 Patient has tenderness to palpation L upper trapezius, L levator scapulae, L scalene muscle, no temperature changes, moisture, or lymph node swelling  Accessory motions:  T1- T12 hypomobile , C 5- C6 hypomobile   POSTURE: rounded shoulders, fwd head  Special tests: + spurlings L SB Negative cervical distraction test, negative ULLT left side Deep neck flexor endurance is poor 5 seconds with substitution of sternocleidomastoid muscle .   PROM/AROM: BUE  WFL with pain in right shoulder during IR,  Quality of motion is poor and slow flex and extension of neck Neck rotation L 30 deg Neck rotation R 40 deg Neck SB left 25 deg Neck SB right 10 deg Neck flex 0 Neck ext 10 deg  STRENGTH:  Graded on a 0-5 scale Muscle Group Left Right  Shoulder flex 4/5 4/5  Shoulder Abd 4/5 4/5  Shoulder Ext 4/5 4/5  Shoulder IR/ER 4/5 4/5  Elbow 4/5 4/5  Wrist/hand 5/5 5/5                                       SENSATION: WNL BUE and neck  FUNCTIONAL MOBILITY: guarded mobility with rolling and supine to prone due to tight chest musc  OUTCOME MEASURES: TEST Outcome Interpretation  NDI 38%  Moderate disability                          Treatment :  Chin tuck  X 10 with cues to look down Rows x 10 with YTB  No increased pain to left neck                    PT Education - 07/02/16 1039    Education provided Yes   Education Details plan of care   Person(s) Educated Patient   Methods Explanation   Comprehension Verbalized understanding             PT Long Term Goals - 07/02/16 1214      PT LONG TERM GOAL #1   Title Patient will  be independent in home exercise program to improve  strength/mobility for better functional independence with ADLs   Time 8   Period Weeks   Status New     PT LONG TERM GOAL #2   Title Patient will increase BLE gross strength to 4+/5 as to improve functional strength for independent gait, increased standing tolerance and increased ADL ability   Baseline 4/5 BUE, poor deep neck flexors wiht 5 sec hold and substitution of SCM   Time 8   Period Weeks   Status New     PT LONG TERM GOAL #3   Title Patient will be able to perform household work/ chores without increase in symptoms.   Baseline Patient has increased symptoms with activity   Time 8   Period Weeks   Status New     PT LONG TERM GOAL #4   Title Patient will report a worst pain of 1/10 on VAS in neck to improve tolerance with ADLs and reduced symptoms with activities.    Baseline 5/10 neck pain   Time 8   Period Weeks   Status New     PT LONG TERM GOAL #5   Title  Patient will reduce NDI score to <28 as to demonstrate minimal disability with ADLs including improved sleeping tolerance, walking/sitting tolerance etc for better mobility with ADLs.    Baseline NDI 38%   Time 8   Period Weeks   Status New               Plan - 2016/07/22 1230    Clinical Impression Statement Patient has chronic neck pain that ranges from 2/10 to 6 /10 and is greater on L side of neck. He has decreased AROM in cervical spine and decreased strength to deep neck flexor muscles. He has decreased NDI  38% and inabiliaty to perform activities without increased pain and discomfort. He will benefit from skilled PT to improve ADL and quality of life and reach goals.    Rehab Potential Good   Clinical Impairments Affecting Rehab Potential This patient presents with 2, personal factors/ comorbidities current situation and pain. and 3 body elements including body structures and functions, activity limitations and or participation restrictions.: decreased AROM, decreased strength cervical musculature,  inability to perform activities with grandchildren.  Patient's condition is  Evolving.   PT Frequency 2x / week   PT Duration 8 weeks   PT Treatment/Interventions Cryotherapy;Aquatic Therapy;Electrical Stimulation;Moist Heat;Traction;Ultrasound;Therapeutic activities;Therapeutic exercise;Manual techniques;Passive range of motion   PT Next Visit Plan modalities, e-stim, heat, manual therapy, stretches   PT Home Exercise Plan chin tucks, rows YTB   Consulted and Agree with Plan of Care Patient      Patient will benefit from skilled therapeutic intervention in order to improve the following deficits and impairments:  Increased muscle spasms, Decreased activity tolerance, Decreased strength, Impaired flexibility, Pain, Postural dysfunction  Visit Diagnosis: Neck pain  Muscle weakness (generalized)      G-Codes - 2016-07-22 1220    Functional Assessment Tool Used (Outpatient Only) NDI, clinical judgement, AROM cervical spine   Functional Limitation Mobility: Walking and moving around   Mobility: Walking and Moving Around Current Status ([id number]) At least 60 percent but less than 80 percent impaired, limited or restricted   Mobility: Walking and Moving Around Goal Status [phone]) At least 40 percent but less than 60 percent impaired, limited or restricted       Problem List Patient Active Problem List   Diagnosis Date Noted  . Heart  block 04/25/2016   Alanson Puls, PT, DPT [place], [place] S 07/02/2016, 12:52 PM  [place] MAIN [facility] SERVICES [address] [place], [place], [address] Phone: [phone]   Fax:  [phone]  Name: Louis Cooper MRN: [id number] Date of Birth: 02-13-1946

## 2016-07-09 ENCOUNTER — Encounter: Payer: Self-pay | Admitting: Physical Therapy

## 2016-07-09 ENCOUNTER — Ambulatory Visit: Payer: Medicare Other | Admitting: Physical Therapy

## 2016-07-09 DIAGNOSIS — M6281 Muscle weakness (generalized): Secondary | ICD-10-CM

## 2016-07-09 DIAGNOSIS — M542 Cervicalgia: Secondary | ICD-10-CM | POA: Diagnosis not present

## 2016-07-09 NOTE — Therapy (Signed)
[place] MAIN [facility] SERVICES [address] [place], [place], [address] Phone: [phone]   Fax:  [phone]  Physical Therapy Treatment  Patient Details  Name: Louis Cooper MRN: [id number] Date of Birth: 04/10/1945 Referring Provider: Lattie Corns   Encounter Date: 07/09/2016      PT End of Session - 07/09/16 0922    Visit Number 2   Number of Visits 17   Date for PT Re-Evaluation 09-09-2016   Authorization Type g codes medicare 2022/04/12   PT Start Time 0905   PT Stop Time 0945   PT Time Calculation (min) 40 min   Activity Tolerance Patient tolerated treatment well;Patient limited by pain   Behavior During Therapy [facility] for tasks assessed/performed      Past Medical History:  Diagnosis Date  . A-fib ([place])   . Arthritis   . Cataract   . CHF (congestive heart failure) ([place])   . GERD (gastroesophageal reflux disease)   . Gout   . Heart disease   . Hernia of abdominal wall   . History of heart valve replacement   . Hx of CABG   . Hyperlipidemia   . Hyperlipidemia   . Hypertension     Past Surgical History:  Procedure Laterality Date  . CARDIOVERSION N/A 04/02/2016   Procedure: CARDIOVERSION;  Surgeon: Corey Skains, MD;  Location: [facility] ORS;  Service: Cardiovascular;  Laterality: N/A;  . HERNIA REPAIR    . TEE WITHOUT CARDIOVERSION N/A 04/02/2016   Procedure: TRANSESOPHAGEAL ECHOCARDIOGRAM (TEE);  Surgeon: Corey Skains, MD;  Location: [facility] ORS;  Service: Cardiovascular;  Laterality: N/A;  . VALVE REPLACEMENT      There were no vitals filed for this visit.      Subjective Assessment - 07/09/16 0919    Subjective Patient has neck pain and stiffness in his neck and upper traps 5/10.    Pertinent History Patient reports neck pain that began 4-5 months ago. 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 The tightness is constant and the pain comes if he  is in a bumpy car ride or right after he performs exercises. He has constant pain that ranges from 2/10 to 6/10. Patient had a valve replaced and he had his chest opened up and his chest feels like it is a steel plate and is tight.    Limitations Reading   How long can you sit comfortably? no limitation   How long can you stand comfortably? no limitation   How long can you walk comfortably? no limitations   Diagnostic tests x ray   Patient Stated Goals to have less pain   Currently in Pain? Yes   Pain Score 5    Pain Location Neck   Pain Orientation Left   Pain Descriptors / Indicators Discomfort   Pain Type Chronic pain   Pain Radiating Towards not radiating today   Pain Onset More than a month ago   Pain Frequency Constant   Aggravating Factors  reading   Pain Relieving Factors advil   Effect of Pain on Daily Activities unable to sleep as well and unable to play with grandchildren   Multiple Pain Sites --  right sided rib pain and right shoulder pain when he gets up from bed then it goes away      Treatment: AROM to neck in supine: SB left and SB right x 10 Rotation left  and Rotation left x 10 Neck flexion in supine x 10 Chin tuck with 5 sec hold x 10 Upper trap left and right stretch 30 sec x 3 bilaterally Seated scapula retraction with YTB x 10   Patient has no increased pain with exercises .                           PT Education - 07/09/16 [phone]    Education provided Yes   Education Details heat for neck and ROM exericses   Person(s) Educated Patient   Methods Explanation   Comprehension Verbalized understanding             PT Long Term Goals - 07/02/16 1214      PT LONG TERM GOAL #1   Title Patient will be independent in home exercise program to improve strength/mobility for better functional independence with ADLs   Time 8   Period Weeks   Status New     PT LONG TERM GOAL #2   Title Patient will increase BLE gross strength to  4+/5 as to improve functional strength for independent gait, increased standing tolerance and increased ADL ability   Baseline 4/5 BUE, poor deep neck flexors wiht 5 sec hold and substitution of SCM   Time 8   Period Weeks   Status New     PT LONG TERM GOAL #3   Title Patient will be able to perform household work/ chores without increase in symptoms.   Baseline Patient has increased symptoms with activity   Time 8   Period Weeks   Status New     PT LONG TERM GOAL #4   Title Patient will report a worst pain of 1/10 on VAS in neck to improve tolerance with ADLs and reduced symptoms with activities.    Baseline 5/10 neck pain   Time 8   Period Weeks   Status New     PT LONG TERM GOAL #5   Title  Patient will reduce NDI score to <28 as to demonstrate minimal disability with ADLs including improved sleeping tolerance, walking/sitting tolerance etc for better mobility with ADLs.    Baseline NDI 38%   Time 8   Period Weeks   Status New               Plan - 07/09/16 1062    Clinical Impression Statement Patient continues to have chronic neck pain and is 5/10 today. He had increased pain after evaluation from being in prone position and had pain in chest due to past surgery and he is not abel to tolerate the prone position for therapy. He tolerated gentle upper trap stretches in supine bilaterally and AROM exercises in supine and sitting for beginning exercises to improve ROM to neck. Patient  has no increased pain during treatment except during ncek flex exercises, he had some discomfort. He will continue to benefit from skilled PT to improve cervical ROM, decrease neck pain levels to be able to participate in activities with grandchildren.    Rehab Potential Good   Clinical Impairments Affecting Rehab Potential This patient presents with 2, personal factors/ comorbidities current situation and pain. and 3 body elements including body structures and functions, activity limitations and  or participation restrictions.: decreased AROM, decreased strength cervical musculature, inability to perform activities with grandchildren.  Patient's condition is  Evolving.   PT Frequency 2x / week   PT Duration 8 weeks   PT Treatment/Interventions Cryotherapy;Aquatic  Therapy;Electrical Stimulation;Moist Heat;Traction;Ultrasound;Therapeutic activities;Therapeutic exercise;Manual techniques;Passive range of motion   PT Next Visit Plan modalities, e-stim, heat, manual therapy, stretches   PT Home Exercise Plan chin tucks, rows YTB   Consulted and Agree with Plan of Care Patient      Patient will benefit from skilled therapeutic intervention in order to improve the following deficits and impairments:  Increased muscle spasms, Decreased activity tolerance, Decreased strength, Impaired flexibility, Pain, Postural dysfunction  Visit Diagnosis: Neck pain  Muscle weakness (generalized)     Problem List Patient Active Problem List   Diagnosis Date Noted  . Heart block 04/25/2016   Alanson Puls, PT, DPT [place], Minette Headland S 07/09/2016, 9:28 AM  [place] MAIN [facility] SERVICES [address] [place], [place], [address] Phone: [phone]   Fax:  [phone]  Name: Kathryn Linarez MRN: [id number] Date of Birth: Jun 21, 1945

## 2016-07-09 NOTE — Patient Instructions (Addendum)
AROM, Rotation    Sit or stand, head comfortable, centered position. Turn head slowly to look over one shoulder. Hold _5__ seconds. Repeat to other side. Repeat 2___ times per session. Do _2__ sessions per day.  Copyright  VHI. All rights reserved.  AROM, Rotation    Sit or stand, head comfortable, centered position. Turn head slowly to look over one shoulder. Hold __5_ seconds. Repeat to other side. Repeat 2___ times per session. Do __2_ sessions per day.  Copyright  VHI. All rights reserved.  AROM: Lateral Neck Flexion    Slowly tilt head toward one shoulder, then the other. Hold each position ____ seconds. Repeat _10_ times per set. Do __1__ sets per session. Do _2___ sessions per day.  http://orth.exer.us/296   Copyright  VHI. All rights reserved.  AROM: Neck Extension    Bend head backward. Hold ____ seconds. Repeat _10___ times per set. Do __1__ sets per session. Do ___2_ sessions per day.  http://orth.exer.us/300   Copyright  VHI. All rights reserved.  AROM: Neck Rotation    Turn head slowly to look over one shoulder, then the other. Hold each position ___5_ seconds. Repeat __10__ times per set. Do _1___ sets per session. Do ___2_ sessions per day.  http://orth.exer.us/294   Copyright  VHI. All rights reserved.  AROM: Neck Rotation    Turn head slowly to look over one shoulder, then the other. Hold each position ___5_ seconds. Repeat __10__ times per set. Do _1___ sets per session. Do _2___ sessions per day.  http://orth.exer.us/294   Copyright  VHI. All rights reserved.  Extension    Hands behind neck, bend head back as far as is comfortable. Hold ___5_ seconds. Repeat __10__ times. Do 2____ sessions per day.  Copyright  VHI. All rights reserved.  Extensors, Sitting / Standing    Stand or sit, head in comfortable, centered position. Gently tuck chin and bring toward chest. Hold _5__ seconds. Repeat _10__ times per session. Do ___1  sessions per day.  Copyright  VHI. All rights reserved.  Extensors, Sitting / Standing    Sit or stand, hands clasped behind head. Gently push head down. Hold _5__ seconds. Repeat __10_ times per session. Do 2___ sessions per day.  Copyright  VHI. All rights reserved.  Flexibility: Upper Trapezius Stretch    Gently grasp right side of head while reaching behind back with other hand. Tilt head away until a gentle stretch is felt. Hold ___5_ seconds. Repeat _10___ times per set. Do __1__ sets per session. Do _2___ sessions per day.  http://orth.exer.us/340   Copyright  VHI. All rights reserved.  Flexion    Bend head forward as far as is comfortable. Hold __5__ seconds. Repeat 10____ times. Do ___2_ sessions per day.  Copyright  VHI. All rights reserved.  Flexors, Sitting    Sit, right hand on left front side of head. Grip seat with left hand. Keeping body straight, look up and backward, bending neck back and to right. Hold ___ seconds. Repeat ___10 times per session. Do _2__ sessions per day.  Copyright  VHI. All rights reserved.  Head Tilt (Neck Stretch / Mobility)    Sit facing forward. Tilt head to side as if draining water from ear. Hold ___ seconds, breathing slowly in and out through pursed lips. Repeat to opposite side, then forward. Repeat sequence10 ___ times. Do _2__ sessions per day.  Copyright  VHI. All rights reserved.

## 2016-07-11 ENCOUNTER — Ambulatory Visit: Payer: Medicare Other

## 2016-07-11 VITALS — BP 144/81

## 2016-07-11 DIAGNOSIS — M542 Cervicalgia: Secondary | ICD-10-CM | POA: Diagnosis not present

## 2016-07-11 DIAGNOSIS — M6281 Muscle weakness (generalized): Secondary | ICD-10-CM

## 2016-07-11 NOTE — Therapy (Signed)
[place] MAIN [facility] SERVICES [address] [place], [place], [address] Phone: [phone]   Fax:  [phone]  Physical Therapy Treatment  Patient Details  Name: Louis Cooper MRN: [id number] Date of Birth: [birth year] Referring Provider: Lattie Corns   Encounter Date: 07/11/2016      PT End of Session - 07/11/16 1218    Visit Number 3   Number of Visits 17   Date for PT Re-Evaluation 08/30/16   Authorization Type g codes medicare 04-02-2022   PT Start Time 1116   PT Stop Time 1201   PT Time Calculation (min) 45 min   Activity Tolerance Patient tolerated treatment well   Behavior During Therapy [facility] for tasks assessed/performed      Past Medical History:  Diagnosis Date  . A-fib ([place])   . Arthritis   . Cataract   . CHF (congestive heart failure) ([place])   . GERD (gastroesophageal reflux disease)   . Gout   . Heart disease   . Hernia of abdominal wall   . History of heart valve replacement   . Hx of CABG   . Hyperlipidemia   . Hyperlipidemia   . Hypertension     Past Surgical History:  Procedure Laterality Date  . CARDIOVERSION N/A 04/02/2016   Procedure: CARDIOVERSION;  Surgeon: Corey Skains, MD;  Location: [facility] ORS;  Service: Cardiovascular;  Laterality: N/A;  . HERNIA REPAIR    . TEE WITHOUT CARDIOVERSION N/A 04/02/2016   Procedure: TRANSESOPHAGEAL ECHOCARDIOGRAM (TEE);  Surgeon: Corey Skains, MD;  Location: [facility] ORS;  Service: Cardiovascular;  Laterality: N/A;  . VALVE REPLACEMENT      Vitals:   07/11/16 1120  BP: (!) 144/81  SpO2: 98%        Subjective Assessment - 07/11/16 1124    Subjective Patient's BP has been high the past couple days. Stiffness in neck persisting but reports compliance with HEP performing 2x a day. Pt. has also started to implement more physical activity and weight lifting into daily routine.    Currently in Pain? Yes   Pain Score 5    Pain Location Neck   Pain Orientation  Mid;Upper;Lower   Pain Descriptors / Indicators Aching;Constant   Pain Type Chronic pain     TherEx Wall posture stretch in hallway 4x15 seconds. Cues for proper neck positioning and shoulder stretch.  SB R and L with 15 sec hold x 8 each side seated with opp arm behind back.  Extension in seated 10x with 10 second holds Chin tucks 10x5 second holds, cues for proper positioning and body  Mechanics in supine. AAROM (PT assist with overpressure) SB L and R x 8 with 10 second hold overpressure and 2x 60 seconds with cross support on Opp shoulder, Rot L and R x8 with 10 second hold overpressure  Manual: Supine CPA's Grade I-II cervical region with hypomobility noted C5-6. 8-10 sec each level x2, slight discomfort initially that decreased with repetition Occipital release 2x60 seconds, pain relieving    Pt. Response to medical necessity: Patient continues to have cervical pain secondary to spondylosis and cervical musculature tightness. Pain is relieved with stretching and mobilization.       PT Long Term Goals - 07/02/16 1214      PT LONG TERM GOAL #1   Title Patient will be independent in home exercise program to improve strength/mobility for better functional independence with ADLs   Time 8   Period Weeks   Status New  PT LONG TERM GOAL #2   Title Patient will increase BLE gross strength to 4+/5 as to improve functional strength for independent gait, increased standing tolerance and increased ADL ability   Baseline 4/5 BUE, poor deep neck flexors wiht 5 sec hold and substitution of SCM   Time 8   Period Weeks   Status New     PT LONG TERM GOAL #3   Title Patient will be able to perform household work/ chores without increase in symptoms.   Baseline Patient has increased symptoms with activity   Time 8   Period Weeks   Status New     PT LONG TERM GOAL #4   Title Patient will report a worst pain of 1/10 on VAS in neck to improve tolerance with ADLs and reduced symptoms with  activities.    Baseline 5/10 neck pain   Time 8   Period Weeks   Status New     PT LONG TERM GOAL #5   Title  Patient will reduce NDI score to <28 as to demonstrate minimal disability with ADLs including improved sleeping tolerance, walking/sitting tolerance etc for better mobility with ADLs.    Baseline NDI 38%   Time 8   Period Weeks   Status New               Plan - 07/11/16 1218    Clinical Impression Statement Patient continues to have cervical pain secondary to spondylosis and cervical musculature tightness. Pain was decreased with stretching and mobilization. Unable to perform mobilizations in prone due to hx of chest wall/heart involvement so modified positioning was performed in supine with Grade I-II CPAs tolerated. Cervical region is hypomobile with focal point of C5-.6.  Suboccipital release was pain relieving. Patient has difficulty with posture due to [facility] and was educated on proper positioning and wall stretch for home. Patient will benefit from continued skilled physical therapy to increase cervical ROM, control pain, and allow for return to prior level of function.    Rehab Potential Good   Clinical Impairments Affecting Rehab Potential This patient presents with 2, personal factors/ comorbidities current situation and pain. and 3 body elements including body structures and functions, activity limitations and or participation restrictions.: decreased AROM, decreased strength cervical musculature, inability to perform activities with grandchildren.  Patient's condition is  Evolving.   PT Frequency 2x / week   PT Duration 8 weeks   PT Treatment/Interventions Cryotherapy;Aquatic Therapy;Electrical Stimulation;Moist Heat;Traction;Ultrasound;Therapeutic activities;Therapeutic exercise;Manual techniques;Passive range of motion   PT Next Visit Plan modalities, e-stim, heat, manual therapy, stretches   PT Home Exercise Plan chin tucks, rows YTB   Consulted and Agree with Plan  of Care Patient      Patient will benefit from skilled therapeutic intervention in order to improve the following deficits and impairments:  Increased muscle spasms, Decreased activity tolerance, Decreased strength, Impaired flexibility, Pain, Postural dysfunction  Visit Diagnosis: Neck pain  Muscle weakness (generalized)     Problem List Patient Active Problem List   Diagnosis Date Noted  . Heart block 04/25/2016   Janna Arch, PT, DPT  07/11/2016, 12:20 PM  [place] MAIN [facility] SERVICES [address] [place], [place], [address] Phone: [phone]   Fax:  [phone]  Name: Louis Cooper MRN: [id number] Date of Birth: 06-Sep-1945

## 2016-07-16 ENCOUNTER — Encounter: Payer: Self-pay | Admitting: Physical Therapy

## 2016-07-16 ENCOUNTER — Ambulatory Visit: Payer: Medicare Other | Admitting: Physical Therapy

## 2016-07-16 DIAGNOSIS — M542 Cervicalgia: Secondary | ICD-10-CM

## 2016-07-16 DIAGNOSIS — M6281 Muscle weakness (generalized): Secondary | ICD-10-CM

## 2016-07-16 NOTE — Therapy (Signed)
[place] MAIN [facility] SERVICES [address] [place], [place], [address] Phone: [phone]   Fax:  [phone]  Physical Therapy Treatment  Patient Details  Name: Louis Cooper MRN: [id number] Date of Birth: 1945-07-26 Referring Provider: Lattie Corns   Encounter Date: 07/16/2016      PT End of Session - 07/16/16 1026    Visit Number 4   Number of Visits 17   Date for PT Re-Evaluation 09/22/16   Authorization Type g codes medicare 06-24-22   PT Start Time 1020   PT Stop Time 1058   PT Time Calculation (min) 38 min   Activity Tolerance Patient tolerated treatment well   Behavior During Therapy [facility] for tasks assessed/performed      Past Medical History:  Diagnosis Date  . A-fib ([place])   . Arthritis   . Cataract   . CHF (congestive heart failure) ([place])   . GERD (gastroesophageal reflux disease)   . Gout   . Heart disease   . Hernia of abdominal wall   . History of heart valve replacement   . Hx of CABG   . Hyperlipidemia   . Hyperlipidemia   . Hypertension     Past Surgical History:  Procedure Laterality Date  . CARDIOVERSION N/A 04/02/2016   Procedure: CARDIOVERSION;  Surgeon: Corey Skains, MD;  Location: [facility] ORS;  Service: Cardiovascular;  Laterality: N/A;  . HERNIA REPAIR    . TEE WITHOUT CARDIOVERSION N/A 04/02/2016   Procedure: TRANSESOPHAGEAL ECHOCARDIOGRAM (TEE);  Surgeon: Corey Skains, MD;  Location: [facility] ORS;  Service: Cardiovascular;  Laterality: N/A;  . VALVE REPLACEMENT      There were no vitals filed for this visit.      Subjective Assessment - 07/16/16 1024    Subjective Stiffness in neck persisting but reports compliance with HEP performing 2x a day. Patient reports 5/10 pain today   Pertinent History Patient reports neck pain that began 4-5 months ago. 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 The tightness is constant and  the pain comes if he is in a bumpy car ride or right after he performs exercises. He has constant pain that ranges from 2/10 to 6/10. Patient had a valve replaced and he had his chest opened up and his chest feels like it is a steel plate and is tight.    Limitations Reading   How long can you sit comfortably? no limitation   How long can you stand comfortably? no limitation   How long can you walk comfortably? no limitations   Diagnostic tests x ray   Patient Stated Goals to have less pain   Currently in Pain? Yes   Pain Score 5    Pain Location Neck   Pain Orientation Lower;Mid;Upper   Pain Descriptors / Indicators Tightness;Aching   Pain Type Chronic pain   Pain Onset More than a month ago   Pain Frequency Constant   Aggravating Factors  reading   Pain Relieving Factors advil   Effect of Pain on Daily Activities unable to sleep as well   Multiple Pain Sites No        TherEx UBE x 5 minutes L 4 with cues for correct posture Wall posture stretch in hallway 4x15 seconds. Cues for proper neck positioning and shoulder stretch.  SB R and L with 15 sec hold x 8 each side seated with opp arm behind  back.  Extension in seated 10x with 10 second holds Chin tucks 10x5 second holds, cues for proper positioning and body  Mechanics in supine. AAROM (PT assist with overpressure) SB L and R x 8 with 10 second hold overpressure and 2x 60 seconds with cross support on Opp shoulder, Rot L and R x8 with 10 second hold overpressure  Manual: Supine CPA's Grade I-II cervical region with hypomobility noted C5-6. 8-10 sec each level x2, slight discomfort initially that decreased with repetition Occipital release 2x60 seconds, pain relieving    Pt. Response to medical necessity: Patient continues to have cervical pain secondary to spondylosis and cervical musculature tightness. Pain is relieved with stretching and mobilization.                           PT Education - 07/16/16  1026    Education provided Yes   Education Details education with posture   Person(s) Educated Patient   Methods Explanation   Comprehension Verbalized understanding;Returned demonstration;Verbal cues required             PT Long Term Goals - 07/02/16 1214      PT LONG TERM GOAL #1   Title Patient will be independent in home exercise program to improve strength/mobility for better functional independence with ADLs   Time 8   Period Weeks   Status New     PT LONG TERM GOAL #2   Title Patient will increase BLE gross strength to 4+/5 as to improve functional strength for independent gait, increased standing tolerance and increased ADL ability   Baseline 4/5 BUE, poor deep neck flexors wiht 5 sec hold and substitution of SCM   Time 8   Period Weeks   Status New     PT LONG TERM GOAL #3   Title Patient will be able to perform household work/ chores without increase in symptoms.   Baseline Patient has increased symptoms with activity   Time 8   Period Weeks   Status New     PT LONG TERM GOAL #4   Title Patient will report a worst pain of 1/10 on VAS in neck to improve tolerance with ADLs and reduced symptoms with activities.    Baseline 5/10 neck pain   Time 8   Period Weeks   Status New     PT LONG TERM GOAL #5   Title  Patient will reduce NDI score to <28 as to demonstrate minimal disability with ADLs including improved sleeping tolerance, walking/sitting tolerance etc for better mobility with ADLs.    Baseline NDI 38%   Time 8   Period Weeks   Status New               Plan - 07/16/16 1027    Clinical Impression Statement Patient continues to have chronic neck pain and is 5/10 today.  He tolerated gentle neck stretches in supine and AROM exercises in supine and sitting for beginning exercises to improve ROM to neck. Patient has no increased pain during treatment. He will benefit from skilled PT to improve cervical ROM and decrease pain levels to be able to  participate in activities with grandchildren.    Rehab Potential Good   Clinical Impairments Affecting Rehab Potential This patient presents with 2, personal factors/ comorbidities current situation and pain. and 3 body elements including body structures and functions, activity limitations and or participation restrictions.: decreased AROM, decreased strength cervical musculature, inability to perform  activities with grandchildren.  Patient's condition is  Evolving.   PT Frequency 2x / week   PT Duration 8 weeks   PT Treatment/Interventions Cryotherapy;Aquatic Therapy;Electrical Stimulation;Moist Heat;Traction;Ultrasound;Therapeutic activities;Therapeutic exercise;Manual techniques;Passive range of motion   PT Next Visit Plan modalities, e-stim, heat, manual therapy, stretches   PT Home Exercise Plan chin tucks, rows YTB   Consulted and Agree with Plan of Care Patient      Patient will benefit from skilled therapeutic intervention in order to improve the following deficits and impairments:  Increased muscle spasms, Decreased activity tolerance, Decreased strength, Impaired flexibility, Pain, Postural dysfunction  Visit Diagnosis: Neck pain  Muscle weakness (generalized)     Problem List Patient Active Problem List   Diagnosis Date Noted  . Heart block 04/25/2016   Alanson Puls, PT, DPT [place], Minette Headland S 07/16/2016, 10:33 AM  [place] MAIN [facility] SERVICES [address] [place], [place], [address] Phone: [phone]   Fax:  [phone]  Name: Louis Cooper MRN: [id number] Date of Birth: May 08, 1945

## 2016-07-18 ENCOUNTER — Ambulatory Visit: Payer: Medicare Other | Admitting: Physical Therapy

## 2016-07-18 ENCOUNTER — Encounter: Payer: Self-pay | Admitting: Physical Therapy

## 2016-07-18 DIAGNOSIS — M542 Cervicalgia: Secondary | ICD-10-CM

## 2016-07-18 DIAGNOSIS — M6281 Muscle weakness (generalized): Secondary | ICD-10-CM

## 2016-07-18 NOTE — Therapy (Signed)
[place] MAIN [facility] SERVICES [address] [place], [place], [address] Phone: [phone]   Fax:  [phone]  Physical Therapy Treatment  Patient Details  Name: Louis Cooper MRN: [id number] Date of Birth: 30-Oct-1945 Referring Provider: Lattie Corns   Encounter Date: 07/18/2016      PT End of Session - 07/18/16 1613    Visit Number 5   Number of Visits 17   Date for PT Re-Evaluation 09-26-16   Authorization Type g codes medicare 5/10   PT Start Time 1611   PT Stop Time 1650   PT Time Calculation (min) 39 min   Activity Tolerance Patient tolerated treatment well;Patient limited by pain   Behavior During Therapy [facility] for tasks assessed/performed      Past Medical History:  Diagnosis Date  . A-fib ([place])   . Arthritis   . Cataract   . CHF (congestive heart failure) ([place])   . GERD (gastroesophageal reflux disease)   . Gout   . Heart disease   . Hernia of abdominal wall   . History of heart valve replacement   . Hx of CABG   . Hyperlipidemia   . Hyperlipidemia   . Hypertension     Past Surgical History:  Procedure Laterality Date  . CARDIOVERSION N/A 04/02/2016   Procedure: CARDIOVERSION;  Surgeon: Corey Skains, MD;  Location: [facility] ORS;  Service: Cardiovascular;  Laterality: N/A;  . HERNIA REPAIR    . TEE WITHOUT CARDIOVERSION N/A 04/02/2016   Procedure: TRANSESOPHAGEAL ECHOCARDIOGRAM (TEE);  Surgeon: Corey Skains, MD;  Location: [facility] ORS;  Service: Cardiovascular;  Laterality: N/A;  . VALVE REPLACEMENT      There were no vitals filed for this visit.      Subjective Assessment - 07/18/16 1611    Subjective Stiffness in neck persisting but reports compliance with HEP performing 2x a day. Patient reports 5/10 pain today   Pertinent History Patient reports neck pain that began 4-5 months ago. 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 The  tightness is constant and the pain comes if he is in a bumpy car ride or right after he performs exercises. He has constant pain that ranges from 2/10 to 6/10. Patient had a valve replaced and he had his chest opened up and his chest feels like it is a steel plate and is tight.    Limitations Reading   How long can you sit comfortably? no limitation   How long can you stand comfortably? no limitation   How long can you walk comfortably? no limitations   Diagnostic tests x ray   Patient Stated Goals to have less pain   Currently in Pain? Yes   Pain Score 5    Pain Location Neck   Pain Orientation Lower;Mid   Pain Descriptors / Indicators Tightness;Aching   Pain Type Chronic pain   Pain Onset More than a month ago   Pain Frequency Constant   Aggravating Factors  reading   Pain Relieving Factors advill   Effect of Pain on Daily Activities unable to sleep as well   Multiple Pain Sites No       TherEx UBE x 5 minutes L 4 with cues for correct posture Wall posture stretch  4x15 seconds. Cues for proper neck positioning and shoulder stretch.  SB R and L with 15 sec hold x 8 each side seated with opp arm  behind back.  Extension in seated 10x with 10 second holds Chin tucks 10x5 second holds, cues for proper positioning and body Mechanics in supine. AAROM (PT assist with overpressure) SB L and R x 8 with 10 second hold overpressure and 2x 60 seconds with cross support on Opp shoulder, Rot L and R x8 with 10 second hold overpressure Reviewed stretches for HEP  Manual: Roller stick to cervical musculature including upper traps left side    Pt. Response to medical necessity: Patient continues to have cervical pain secondary to spondylosis and cervical musculature tightness. Pain is relieved with stretching and mobilization.                           PT Education - 07/18/16 1612    Education provided Yes   Education Details education with posture   Person(s)  Educated Patient   Methods Explanation   Comprehension Verbalized understanding;Verbal cues required;Returned demonstration             PT Long Term Goals - 07/02/16 1214      PT LONG TERM GOAL #1   Title Patient will be independent in home exercise program to improve strength/mobility for better functional independence with ADLs   Time 8   Period Weeks   Status New     PT LONG TERM GOAL #2   Title Patient will increase BLE gross strength to 4+/5 as to improve functional strength for independent gait, increased standing tolerance and increased ADL ability   Baseline 4/5 BUE, poor deep neck flexors wiht 5 sec hold and substitution of SCM   Time 8   Period Weeks   Status New     PT LONG TERM GOAL #3   Title Patient will be able to perform household work/ chores without increase in symptoms.   Baseline Patient has increased symptoms with activity   Time 8   Period Weeks   Status New     PT LONG TERM GOAL #4   Title Patient will report a worst pain of 1/10 on VAS in neck to improve tolerance with ADLs and reduced symptoms with activities.    Baseline 5/10 neck pain   Time 8   Period Weeks   Status New     PT LONG TERM GOAL #5   Title  Patient will reduce NDI score to <28 as to demonstrate minimal disability with ADLs including improved sleeping tolerance, walking/sitting tolerance etc for better mobility with ADLs.    Baseline NDI 38%   Time 8   Period Weeks   Status New               Plan - 07/18/16 1615    Clinical Impression Statement Patient has chronic neck pain 5 /10 and is greater on L side of neck. He has decreased AROM in cervical spine and decreased strength to deep neck flexor muscles. He has  inabiliaty to perform activities without increased pain and discomfort. He will continue to benefit from skilled PT to improve ADL and quality of life and reach goals   Rehab Potential Good   Clinical Impairments Affecting Rehab Potential This patient presents  with 2, personal factors/ comorbidities current situation and pain. and 3 body elements including body structures and functions, activity limitations and or participation restrictions.: decreased AROM, decreased strength cervical musculature, inability to perform activities with grandchildren.  Patient's condition is  Evolving.   PT Frequency 2x / week   PT  Duration 8 weeks   PT Treatment/Interventions Cryotherapy;Aquatic Therapy;Electrical Stimulation;Moist Heat;Traction;Ultrasound;Therapeutic activities;Therapeutic exercise;Manual techniques;Passive range of motion   PT Next Visit Plan modalities, e-stim, heat, manual therapy, stretches   PT Home Exercise Plan chin tucks, rows YTB   Consulted and Agree with Plan of Care Patient      Patient will benefit from skilled therapeutic intervention in order to improve the following deficits and impairments:  Increased muscle spasms, Decreased activity tolerance, Decreased strength, Impaired flexibility, Pain, Postural dysfunction  Visit Diagnosis: Neck pain  Muscle weakness (generalized)     Problem List Patient Active Problem List   Diagnosis Date Noted  . Heart block 04/25/2016   Alanson Puls, PT, DPT [place], Minette Headland S 07/18/2016, 4:57 PM  [place] MAIN [facility] SERVICES [address] [place], [place], [address] Phone: [phone]   Fax:  [phone]  Name: Louis Cooper MRN: [id number] Date of Birth: June 11, 1945

## 2016-07-23 ENCOUNTER — Ambulatory Visit: Payer: Medicare Other | Attending: Student | Admitting: Physical Therapy

## 2016-07-23 ENCOUNTER — Encounter: Payer: Self-pay | Admitting: Physical Therapy

## 2016-07-23 DIAGNOSIS — M6281 Muscle weakness (generalized): Secondary | ICD-10-CM | POA: Diagnosis present

## 2016-07-23 DIAGNOSIS — M542 Cervicalgia: Secondary | ICD-10-CM | POA: Diagnosis present

## 2016-07-23 NOTE — Therapy (Signed)
[place] MAIN [facility] SERVICES [address] [place], [place], [address] Phone: [phone]   Fax:  [phone]  Physical Therapy Treatment  Patient Details  Name: Louis Cooper MRN: [id number] Date of Birth: 10-12-1945 Referring Provider: Lattie Corns   Encounter Date: 07/23/2016      PT End of Session - 07/23/16 0939    Visit Number 6   Number of Visits 17   Date for PT Re-Evaluation 2016-09-07   Authorization Type g codes medicare 6/10   PT Start Time 0915   PT Stop Time 1000   PT Time Calculation (min) 45 min   Activity Tolerance Patient tolerated treatment well;Patient limited by pain   Behavior During Therapy [facility] for tasks assessed/performed      Past Medical History:  Diagnosis Date  . A-fib ([place])   . Arthritis   . Cataract   . CHF (congestive heart failure) ([place])   . GERD (gastroesophageal reflux disease)   . Gout   . Heart disease   . Hernia of abdominal wall   . History of heart valve replacement   . Hx of CABG   . Hyperlipidemia   . Hyperlipidemia   . Hypertension     Past Surgical History:  Procedure Laterality Date  . CARDIOVERSION N/A 04/02/2016   Procedure: CARDIOVERSION;  Surgeon: Corey Skains, MD;  Location: [facility] ORS;  Service: Cardiovascular;  Laterality: N/A;  . HERNIA REPAIR    . TEE WITHOUT CARDIOVERSION N/A 04/02/2016   Procedure: TRANSESOPHAGEAL ECHOCARDIOGRAM (TEE);  Surgeon: Corey Skains, MD;  Location: [facility] ORS;  Service: Cardiovascular;  Laterality: N/A;  . VALVE REPLACEMENT      There were no vitals filed for this visit.      Subjective Assessment - 07/23/16 0938    Subjective Stiffness in neck persisting but reports compliance with HEP performing 2x a day. Patient reports 3/10 pain today   Pertinent History Patient reports neck pain that began 4-5 months ago. 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 The  tightness is constant and the pain comes if he is in a bumpy car ride or right after he performs exercises. He has constant pain that ranges from 2/10 to 6/10. Patient had a valve replaced and he had his chest opened up and his chest feels like it is a steel plate and is tight.    Limitations Reading   How long can you sit comfortably? no limitation   How long can you stand comfortably? no limitation   How long can you walk comfortably? no limitations   Diagnostic tests x ray   Patient Stated Goals to have less pain   Currently in Pain? Yes   Pain Score 3    Pain Location Neck   Pain Orientation Mid;Lower   Pain Descriptors / Indicators Aching;Tightness   Pain Type Chronic pain   Pain Radiating Towards not radiating   Pain Onset More than a month ago   Pain Frequency Constant   Aggravating Factors  reading   Pain Relieving Factors advill   Effect of Pain on Daily Activities unable to sleep as well   Multiple Pain Sites No      TherEx:  Quality of motion is poor and slow flex and extension of neck Neck rotation L 40 deg Neck rotation R 40 deg Neck SB left 30 deg Neck SB right 15 deg Neck flex 15  Neck ext 20 deg All above neck motions are improving with less pain and discomfort 3/5 today UBE x 5 minutes L 4 with cues for correct posture Wall posture stretch  4x15 seconds. Cues for proper neck positioning and shoulder stretch.  SB R and L with 15 sec hold x 8 each side seated with opp arm behind back.  Extension in seated 10x with 10 second holds Chin tucks 10x5 second holds, cues for proper positioning and body Mechanics in supine. AAROM (PT assist with overpressure) SB L and R x 8 with 10 second hold overpressure and 2x 60 seconds with cross support on Opp shoulder, Rot L and R x8 with 10 second hold overpressure Reviewed stretches for HEP  Manual: Roller stick to cervical musculature including upper traps BUE and scapula musculature    Pt. Response to medical  necessity: Patient continues to have cervical pain secondary to spondylosis and cervical musculature tightness. Pain is relieved with stretching and mobilization and decreased to 1/10 following therapy.                            PT Education - 07/23/16 [phone]    Education provided Yes   Education Details eduation with posture   Person(s) Educated Patient   Methods Explanation   Comprehension Verbalized understanding             PT Long Term Goals - 07/02/16 1214      PT LONG TERM GOAL #1   Title Patient will be independent in home exercise program to improve strength/mobility for better functional independence with ADLs   Time 8   Period Weeks   Status New     PT LONG TERM GOAL #2   Title Patient will increase BLE gross strength to 4+/5 as to improve functional strength for independent gait, increased standing tolerance and increased ADL ability   Baseline 4/5 BUE, poor deep neck flexors wiht 5 sec hold and substitution of SCM   Time 8   Period Weeks   Status New     PT LONG TERM GOAL #3   Title Patient will be able to perform household work/ chores without increase in symptoms.   Baseline Patient has increased symptoms with activity   Time 8   Period Weeks   Status New     PT LONG TERM GOAL #4   Title Patient will report a worst pain of 1/10 on VAS in neck to improve tolerance with ADLs and reduced symptoms with activities.    Baseline 5/10 neck pain   Time 8   Period Weeks   Status New     PT LONG TERM GOAL #5   Title  Patient will reduce NDI score to <28 as to demonstrate minimal disability with ADLs including improved sleeping tolerance, walking/sitting tolerance etc for better mobility with ADLs.    Baseline NDI 38%   Time 8   Period Weeks   Status New               Plan - 07/23/16 0940    Clinical Impression Statement Patient continues to have chronic neck pain and is 5/10 today. He had increased pain after evaluation from  being in prone position and had pain in chest due to past surgery and he is not abel to tolerate the prone position for therapy. He tolerated gentle neck stretches in supine and AROM exercises in supine and sitting for beginning exercises to improve  ROM to neck. Patient has no increased pain during treatment. He will benefit from skilled PT to improve cervical ROM and decrease pain levels to be able to participate in activities with grandchildren.    Rehab Potential Good   Clinical Impairments Affecting Rehab Potential This patient presents with 2, personal factors/ comorbidities current situation and pain. and 3 body elements including body structures and functions, activity limitations and or participation restrictions.: decreased AROM, decreased strength cervical musculature, inability to perform activities with grandchildren.  Patient's condition is  Evolving.   PT Frequency 2x / week   PT Duration 8 weeks   PT Treatment/Interventions Cryotherapy;Aquatic Therapy;Electrical Stimulation;Moist Heat;Traction;Ultrasound;Therapeutic activities;Therapeutic exercise;Manual techniques;Passive range of motion   PT Next Visit Plan modalities, e-stim, heat, manual therapy, stretches   PT Home Exercise Plan chin tucks, rows YTB   Consulted and Agree with Plan of Care Patient      Patient will benefit from skilled therapeutic intervention in order to improve the following deficits and impairments:  Increased muscle spasms, Decreased activity tolerance, Decreased strength, Impaired flexibility, Pain, Postural dysfunction  Visit Diagnosis: Neck pain  Muscle weakness (generalized)     Problem List Patient Active Problem List   Diagnosis Date Noted  . Heart block 04/25/2016   Alanson Puls, PT, DPT [place], Minette Headland S 07/23/2016, 9:41 AM  [place] MAIN [facility] SERVICES [address] [place], [place], [address] Phone: [phone]   Fax:   [phone]  Name: Louis Cooper MRN: [id number] Date of Birth: 03/11/1945

## 2016-07-25 ENCOUNTER — Ambulatory Visit: Payer: Medicare Other | Admitting: Physical Therapy

## 2016-07-25 ENCOUNTER — Encounter: Payer: Self-pay | Admitting: Physical Therapy

## 2016-07-25 DIAGNOSIS — M542 Cervicalgia: Secondary | ICD-10-CM | POA: Diagnosis not present

## 2016-07-25 DIAGNOSIS — M6281 Muscle weakness (generalized): Secondary | ICD-10-CM

## 2016-07-25 NOTE — Therapy (Signed)
[place] MAIN [facility] SERVICES [address] [place], [place], [address] Phone: [phone]   Fax:  [phone]  Physical Therapy Treatment  Patient Details  Name: Louis Cooper MRN: [id number] Date of Birth: 1945-06-29 Referring Provider: Lattie Corns   Encounter Date: 07/25/2016      PT End of Session - 07/25/16 1507    Visit Number 7   Number of Visits 17   Date for PT Re-Evaluation 09-06-2016   Authorization Type g codes medicare 710   PT Start Time 0245   PT Stop Time 0330   PT Time Calculation (min) 45 min   Activity Tolerance Patient tolerated treatment well;Patient limited by pain   Behavior During Therapy [facility] for tasks assessed/performed      Past Medical History:  Diagnosis Date  . A-fib ([place])   . Arthritis   . Cataract   . CHF (congestive heart failure) ([place])   . GERD (gastroesophageal reflux disease)   . Gout   . Heart disease   . Hernia of abdominal wall   . History of heart valve replacement   . Hx of CABG   . Hyperlipidemia   . Hyperlipidemia   . Hypertension     Past Surgical History:  Procedure Laterality Date  . CARDIOVERSION N/A 04/02/2016   Procedure: CARDIOVERSION;  Surgeon: Corey Skains, MD;  Location: [facility] ORS;  Service: Cardiovascular;  Laterality: N/A;  . HERNIA REPAIR    . TEE WITHOUT CARDIOVERSION N/A 04/02/2016   Procedure: TRANSESOPHAGEAL ECHOCARDIOGRAM (TEE);  Surgeon: Corey Skains, MD;  Location: [facility] ORS;  Service: Cardiovascular;  Laterality: N/A;  . VALVE REPLACEMENT      There were no vitals filed for this visit.      Subjective Assessment - 07/25/16 1506    Subjective Stiffness in neck persisting but reports compliance with HEP performing 2x a day. Patient reports 5/10 pain today   Pertinent History Patient reports neck pain that began 4-5 months ago. 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 The  tightness is constant and the pain comes if he is in a bumpy car ride or right after he performs exercises. He has constant pain that ranges from 2/10 to 6/10. Patient had a valve replaced and he had his chest opened up and his chest feels like it is a steel plate and is tight.    Limitations Reading   How long can you sit comfortably? no limitation   How long can you stand comfortably? no limitation   How long can you walk comfortably? no limitations   Diagnostic tests x ray   Patient Stated Goals to have less pain   Currently in Pain? Yes   Pain Score 5    Pain Location Neck   Pain Orientation Mid;Lower   Pain Descriptors / Indicators Tightness;Aching   Pain Type Chronic pain   Pain Radiating Towards not radiating   Pain Onset More than a month ago   Pain Frequency Constant   Aggravating Factors  reading   Pain Relieving Factors advill   Effect of Pain on Daily Activities unable to sleep as well   Multiple Pain Sites No      Treatment: AROM to neck in supine: Rotation left and Rotation left x 10, 5 sec hold Neck flexion in supine x 10 Upper trap left and right stretch 30 sec x 3 bilaterally Seated scapula retraction with  YTB x 10 UBE x 5 minutes L 4 with cues for correct posture Wall posture stretch 4x15 seconds. Cues for proper neck positioning and shoulder stretch.  SB R and L with 15 sec hold x 8 each side seated with opp arm behind back.  Extension in seated 10x with 10 second holds Chin tucks 10x5 second holds, cues for proper positioning and body Mechanics in supine. AAROM (PT assist with overpressure) SB L and R x 8 with 10 second hold overpressure and 2x 60 seconds with cross support on Opp shoulder, Rot L and R x8 with 10 second hold overpressure Reviewed stretches for HEP  Manual: Roller stick to cervical musculature including upper traps BUE and scapula musculature  Cervical traction intermittent 25 lbs x 30 sec x 10 mintues   Pt. Response to medical  necessity: Patient continues to have cervical pain secondary to spondylosis and cervical musculature tightness. Pain is relieved with stretching and mobilization and decreased to 1/10 following therapy                           PT Education - 07/25/16 1507    Education provided Yes   Education Details education on posture   Person(s) Educated Patient   Methods Explanation   Comprehension Verbalized understanding             PT Long Term Goals - 07/02/16 1214      PT LONG TERM GOAL #1   Title Patient will be independent in home exercise program to improve strength/mobility for better functional independence with ADLs   Time 8   Period Weeks   Status New     PT LONG TERM GOAL #2   Title Patient will increase BLE gross strength to 4+/5 as to improve functional strength for independent gait, increased standing tolerance and increased ADL ability   Baseline 4/5 BUE, poor deep neck flexors wiht 5 sec hold and substitution of SCM   Time 8   Period Weeks   Status New     PT LONG TERM GOAL #3   Title Patient will be able to perform household work/ chores without increase in symptoms.   Baseline Patient has increased symptoms with activity   Time 8   Period Weeks   Status New     PT LONG TERM GOAL #4   Title Patient will report a worst pain of 1/10 on VAS in neck to improve tolerance with ADLs and reduced symptoms with activities.    Baseline 5/10 neck pain   Time 8   Period Weeks   Status New     PT LONG TERM GOAL #5   Title  Patient will reduce NDI score to <28 as to demonstrate minimal disability with ADLs including improved sleeping tolerance, walking/sitting tolerance etc for better mobility with ADLs.    Baseline NDI 38%   Time 8   Period Weeks   Status New               Plan - 07/25/16 1521    Clinical Impression Statement Patient has chronic neck pain that ranges from 2/10 to 6 /10 and is greater on L side of neck. He has decreased  AROM in cervical spine and decreased strength to deep neck flexor muscles. He has decreased NDI 38% and inabiliaty to perform activities without increased pain and discomfort. He will benefit from skilled PT to improve ADL and quality of life and reach goals  Rehab Potential Good   Clinical Impairments Affecting Rehab Potential This patient presents with 2, personal factors/ comorbidities current situation and pain. and 3 body elements including body structures and functions, activity limitations and or participation restrictions.: decreased AROM, decreased strength cervical musculature, inability to perform activities with grandchildren.  Patient's condition is  Evolving.   PT Frequency 2x / week   PT Duration 8 weeks   PT Treatment/Interventions Cryotherapy;Aquatic Therapy;Electrical Stimulation;Moist Heat;Traction;Ultrasound;Therapeutic activities;Therapeutic exercise;Manual techniques;Passive range of motion   PT Next Visit Plan modalities, e-stim, heat, manual therapy, stretches   PT Home Exercise Plan chin tucks, rows YTB   Consulted and Agree with Plan of Care Patient      Patient will benefit from skilled therapeutic intervention in order to improve the following deficits and impairments:  Increased muscle spasms, Decreased activity tolerance, Decreased strength, Impaired flexibility, Pain, Postural dysfunction  Visit Diagnosis: Neck pain  Muscle weakness (generalized)     Problem List Patient Active Problem List   Diagnosis Date Noted  . Heart block 04/25/2016   Alanson Puls, PT, DPT [place], [place] S 07/25/2016, 3:24 PM  [facility] [facility] MAIN [facility] SERVICES [address] [place], [place], [address] Phone: [phone]   Fax:  [phone]  Name: Louis Cooper MRN: [id number] Date of Birth: [birth year]

## 2016-07-30 ENCOUNTER — Ambulatory Visit: Payer: Medicare Other | Admitting: Physical Therapy

## 2016-08-01 ENCOUNTER — Ambulatory Visit: Payer: Medicare Other | Admitting: Physical Therapy

## 2016-08-01 ENCOUNTER — Encounter: Payer: Self-pay | Admitting: Physical Therapy

## 2016-08-01 DIAGNOSIS — M542 Cervicalgia: Secondary | ICD-10-CM

## 2016-08-01 DIAGNOSIS — M6281 Muscle weakness (generalized): Secondary | ICD-10-CM

## 2016-08-01 NOTE — Therapy (Signed)
[place] MAIN [facility] SERVICES [address] [place], [place], [address] Phone: [phone]   Fax:  [phone]  Physical Therapy Treatment  Patient Details  Name: Louis Cooper MRN: [id number] Date of Birth: 28-Nov-1945 Referring Provider: Lattie Corns   Encounter Date: 08/01/2016      PT End of Session - 08/01/16 1142    Visit Number 8   Number of Visits 17   Date for PT Re-Evaluation Sep 02, 2016   Authorization Type g codes medicare 8/10   PT Start Time 2707   PT Stop Time 1210   PT Time Calculation (min) 39 min   Activity Tolerance Patient tolerated treatment well;Patient limited by pain   Behavior During Therapy [facility] for tasks assessed/performed      Past Medical History:  Diagnosis Date  . A-fib ([place])   . Arthritis   . Cataract   . CHF (congestive heart failure) ([place])   . GERD (gastroesophageal reflux disease)   . Gout   . Heart disease   . Hernia of abdominal wall   . History of heart valve replacement   . Hx of CABG   . Hyperlipidemia   . Hyperlipidemia   . Hypertension     Past Surgical History:  Procedure Laterality Date  . CARDIOVERSION N/A 04/02/2016   Procedure: CARDIOVERSION;  Surgeon: Corey Skains, MD;  Location: [facility] ORS;  Service: Cardiovascular;  Laterality: N/A;  . HERNIA REPAIR    . TEE WITHOUT CARDIOVERSION N/A 04/02/2016   Procedure: TRANSESOPHAGEAL ECHOCARDIOGRAM (TEE);  Surgeon: Corey Skains, MD;  Location: [facility] ORS;  Service: Cardiovascular;  Laterality: N/A;  . VALVE REPLACEMENT      There were no vitals filed for this visit.      Subjective Assessment - 08/01/16 1140    Subjective Stiffness in neck persisting but reports compliance with HEP performing 2x a day. Patient reports 4/10 pain today. Patient had a rash and went to the MD due to terrible itching.    Pertinent History Patient reports neck pain that began 4-5 months ago. He was the MD and was given spasm medication and  steriod medicine that helped some but he is still having pain. The tightness is constant and the pain comes if he is in a bumpy car ride or right after he performs exercises. He has constant pain that ranges from 2/10 to 6/10. Patient had a valve replaced and he had his chest opened up and his chest feels like it is a steel plate and is tight.    Limitations Reading   How long can you sit comfortably? no limitation   How long can you stand comfortably? no limitation   How long can you walk comfortably? no limitations   Diagnostic tests x ray   Patient Stated Goals to have less pain   Currently in Pain? Yes   Pain Score 4    Pain Location Neck   Pain Orientation Mid;Lower   Pain Descriptors / Indicators Throbbing;Aching   Pain Type Chronic pain   Pain Radiating Towards not radiating   Pain Onset More than a month ago   Pain Frequency Constant   Aggravating Factors  reading   Pain Relieving Factors advill   Effect of Pain on Daily Activities unable to sleep as well   Multiple Pain Sites No       Treatment: AROM to neck in supine: Rotation left and Rotation left x 10, 5 sec hold Neck flexion in supine x 10  Reviewed stretches for HEP  Manual: Roller stick to cervical musculature including upper traps BUE and scapula musculature Cervical traction intermittent 25 lbs x 30 sec x 15 mintues   Pt. Response to medical necessity: Patient continues to have cervical pain secondary to spondylosis and cervical musculature tightness. Pain is relieved with stretching and manual therpay and decreased to 1/10 following therapy                            PT Education - 08/01/16 1141    Education provided Yes   Education Details education about ice and heat and HEP   Person(s) Educated Patient   Methods Explanation   Comprehension Verbalized understanding             PT Long Term Goals - 08/01/16 1347      PT LONG TERM GOAL #1   Title Patient will be  independent in home exercise program to improve strength/mobility for better functional independence with ADLs   Time 8   Period Weeks   Status On-going     PT LONG TERM GOAL #2   Title Patient will increase BLE gross strength to 4+/5 as to improve functional strength for independent gait, increased standing tolerance and increased ADL ability   Baseline 4/5 BUE, poor deep neck flexors wiht 5 sec hold and substitution of SCM   Time 8   Period Weeks   Status On-going     PT LONG TERM GOAL #3   Title Patient will be able to perform household work/ chores without increase in symptoms.   Baseline Patient is having less pain with household chores   Time 8   Period Weeks   Status Partially Met     PT LONG TERM GOAL #4   Title Patient will report a worst pain of 1/10 on VAS in neck to improve tolerance with ADLs and reduced symptoms with activities.    Baseline 4/10 neck pain   Period Weeks   Status Partially Met     PT LONG TERM GOAL #5   Title  Patient will reduce NDI score to <28 as to demonstrate minimal disability with ADLs including improved sleeping tolerance, walking/sitting tolerance etc for better mobility with ADLs.    Baseline NDI 38%   Time 8   Period Weeks   Status Partially Met               Plan - 08/01/16 1144    Clinical Impression Statement Patient has chronic neck pain that ranges from 2/10 to 6 /10 and is greater on L side of neck. He has decreased AROM in cervical spine and decreased strength to deep neck flexor muscles. He has decreased NDI 38% and inabiliaty to perform activities without increased pain and discomfort.He is having pain relief from cervical traction and manual therapy.  He will benefit from skilled PT to improve ADL and quality of life and reach goals   Rehab Potential Good   Clinical Impairments Affecting Rehab Potential This patient presents with 2, personal factors/ comorbidities current situation and pain. and 3 body elements including  body structures and functions, activity limitations and or participation restrictions.: decreased AROM, decreased strength cervical musculature, inability to perform activities with grandchildren.  Patient's condition is  Evolving.   PT Frequency 2x / week   PT Duration 8 weeks   PT Treatment/Interventions Cryotherapy;Aquatic Therapy;Electrical Stimulation;Moist Heat;Traction;Ultrasound;Therapeutic activities;Therapeutic exercise;Manual techniques;Passive range of motion   PT Next Visit  Plan modalities, e-stim, heat, manual therapy, stretches   PT Home Exercise Plan chin tucks, rows YTB   Consulted and Agree with Plan of Care Patient      Patient will benefit from skilled therapeutic intervention in order to improve the following deficits and impairments:  Increased muscle spasms, Decreased activity tolerance, Decreased strength, Impaired flexibility, Pain, Postural dysfunction  Visit Diagnosis: Neck pain  Muscle weakness (generalized)     Problem List Patient Active Problem List   Diagnosis Date Noted  . Heart block 04/25/2016   Alanson Puls, PT, DPT [place], [place] S 08/01/2016, 1:48 PM  [place] MAIN [facility] SERVICES [address] [place], [place], [address] Phone: [phone]   Fax:  [phone]  Name: Tullio Chausse MRN: [id number] Date of Birth: [birth year]

## 2016-08-06 ENCOUNTER — Ambulatory Visit: Payer: Medicare Other | Admitting: Physical Therapy

## 2016-08-06 ENCOUNTER — Encounter: Payer: Self-pay | Admitting: Physical Therapy

## 2016-08-06 DIAGNOSIS — M542 Cervicalgia: Secondary | ICD-10-CM | POA: Diagnosis not present

## 2016-08-06 DIAGNOSIS — M6281 Muscle weakness (generalized): Secondary | ICD-10-CM

## 2016-08-06 NOTE — Therapy (Signed)
[place] MAIN [facility] SERVICES [address] [place], [place], [address] Phone: [phone]   Fax:  [phone]  Physical Therapy Treatment  Patient Details  Name: Louis Cooper MRN: [id number] Date of Birth: October 27, 1945 Referring Provider: Lattie Corns   Encounter Date: 08/06/2016      PT End of Session - 08/06/16 1019    Visit Number 9   Number of Visits 17   Date for PT Re-Evaluation Sep 16, 2016   Authorization Type g codes medicare 2022/11/18   PT Start Time 1008   PT Stop Time 1046   PT Time Calculation (min) 38 min   Activity Tolerance Patient tolerated treatment well;Patient limited by pain   Behavior During Therapy [facility] for tasks assessed/performed      Past Medical History:  Diagnosis Date  . A-fib ([place])   . Arthritis   . Cataract   . CHF (congestive heart failure) ([place])   . GERD (gastroesophageal reflux disease)   . Gout   . Heart disease   . Hernia of abdominal wall   . History of heart valve replacement   . Hx of CABG   . Hyperlipidemia   . Hyperlipidemia   . Hypertension     Past Surgical History:  Procedure Laterality Date  . CARDIOVERSION N/A 04/02/2016   Procedure: CARDIOVERSION;  Surgeon: Corey Skains, MD;  Location: [facility] ORS;  Service: Cardiovascular;  Laterality: N/A;  . HERNIA REPAIR    . TEE WITHOUT CARDIOVERSION N/A 04/02/2016   Procedure: TRANSESOPHAGEAL ECHOCARDIOGRAM (TEE);  Surgeon: Corey Skains, MD;  Location: [facility] ORS;  Service: Cardiovascular;  Laterality: N/A;  . VALVE REPLACEMENT      There were no vitals filed for this visit.      Subjective Assessment - 08/06/16 1016    Subjective Stiffness in neck persisting but reports compliance with HEP performing 2x a day. Patient reports 4/10 pain today. Patient reports that he is in a-fib with HR of 110 BPM.   Pertinent History Patient reports neck pain that began 4-5 months ago. He was the MD and was given spasm medication and steriod  medicine that helped some but he is still having pain. The tightness is constant and the pain comes if he is in a bumpy car ride or right after he performs exercises. He has constant pain that ranges from 2/10 to 6/10. Patient had a valve replaced and he had his chest opened up and his chest feels like it is a steel plate and is tight.    Limitations Reading   How long can you sit comfortably? no limitation   How long can you stand comfortably? no limitation   How long can you walk comfortably? no limitations   Diagnostic tests x ray   Patient Stated Goals to have less pain   Currently in Pain? Yes   Pain Score 4    Pain Location Neck   Pain Orientation Lower;Mid;Left   Pain Descriptors / Indicators Aching;Throbbing   Pain Type Chronic pain   Pain Radiating Towards not radiating   Pain Onset More than a month ago   Pain Frequency Constant   Aggravating Factors  reading   Pain Relieving Factors advill   Effect of Pain on Daily Activities unable to sleep as well      Treatment: AROM to neck in supine: Rotation left and Rotation left x 10, 5 sec hold Neck flexion in supine x 10  Reviewed stretches for HEP  Manual: Roller stick  to cervical musculature including upper traps BUE and scapula musculature Cervical traction intermittent 25 lbs x 30 sec x 15 mintues   Pt. Response to medical necessity: Patient continues to have cervical pain secondary to spondylosis and cervical musculature tightness. Pain is relieved with stretching and manual therpay and decreased to 2/10 following therapy                            PT Education - 08/06/16 1019    Education provided Yes   Education Details education and HEP   Person(s) Educated Patient   Methods Explanation   Comprehension Verbalized understanding             PT Long Term Goals - 08/01/16 1347      PT LONG TERM GOAL #1   Title Patient will be independent in home exercise program to improve  strength/mobility for better functional independence with ADLs   Time 8   Period Weeks   Status On-going     PT LONG TERM GOAL #2   Title Patient will increase BLE gross strength to 4+/5 as to improve functional strength for independent gait, increased standing tolerance and increased ADL ability   Baseline 4/5 BUE, poor deep neck flexors wiht 5 sec hold and substitution of SCM   Time 8   Period Weeks   Status On-going     PT LONG TERM GOAL #3   Title Patient will be able to perform household work/ chores without increase in symptoms.   Baseline Patient is having less pain with household chores   Time 8   Period Weeks   Status Partially Met     PT LONG TERM GOAL #4   Title Patient will report a worst pain of 1/10 on VAS in neck to improve tolerance with ADLs and reduced symptoms with activities.    Baseline 4/10 neck pain   Period Weeks   Status Partially Met     PT LONG TERM GOAL #5   Title  Patient will reduce NDI score to <28 as to demonstrate minimal disability with ADLs including improved sleeping tolerance, walking/sitting tolerance etc for better mobility with ADLs.    Baseline NDI 38%   Time 8   Period Weeks   Status Partially Met               Plan - 08/06/16 1021    Clinical Impression Statement Patient repsonds to cervical traction and STM to left neck and upper thoracic musculature. He is able to decrease his pain in left side to neck from 4/10 to 2/10 following manual therapy. He will continue to benefit from skilled PT to improve mobiltiy and decreased his pain.   Rehab Potential Good   Clinical Impairments Affecting Rehab Potential This patient presents with 2, personal factors/ comorbidities current situation and pain. and 3 body elements including body structures and functions, activity limitations and or participation restrictions.: decreased AROM, decreased strength cervical musculature, inability to perform activities with grandchildren.  Patient's  condition is  Evolving.   PT Frequency 2x / week   PT Duration 8 weeks   PT Treatment/Interventions Cryotherapy;Aquatic Therapy;Electrical Stimulation;Moist Heat;Traction;Ultrasound;Therapeutic activities;Therapeutic exercise;Manual techniques;Passive range of motion   PT Next Visit Plan modalities, e-stim, heat, manual therapy, stretches   PT Home Exercise Plan chin tucks, rows YTB   Consulted and Agree with Plan of Care Patient      Patient will benefit from skilled therapeutic intervention in order  to improve the following deficits and impairments:  Increased muscle spasms, Decreased activity tolerance, Decreased strength, Impaired flexibility, Pain, Postural dysfunction  Visit Diagnosis: Neck pain  Muscle weakness (generalized)     Problem List Patient Active Problem List   Diagnosis Date Noted  . Heart block 04/25/2016   Alanson Puls, PT, DPT [place], [place] S 08/06/2016, 10:28 AM  [place] MAIN [facility] SERVICES [address] [place], [place], [address] Phone: [phone]   Fax:  [phone]  Name: Louis Cooper MRN: [id number] Date of Birth: Sep 23, 1945

## 2016-08-08 ENCOUNTER — Encounter: Payer: Medicare Other | Admitting: Physical Therapy

## 2016-08-13 ENCOUNTER — Ambulatory Visit: Payer: Medicare Other | Admitting: Physical Therapy

## 2016-08-15 ENCOUNTER — Encounter: Payer: Medicare Other | Admitting: Physical Therapy

## 2016-08-20 ENCOUNTER — Encounter: Payer: Medicare Other | Admitting: Physical Therapy

## 2016-08-26 ENCOUNTER — Encounter: Payer: Medicare Other | Admitting: Physical Therapy

## 2016-08-29 ENCOUNTER — Encounter: Payer: Medicare Other | Admitting: Physical Therapy

## 2016-09-03 ENCOUNTER — Encounter: Payer: Medicare Other | Admitting: Physical Therapy

## 2016-09-05 ENCOUNTER — Encounter: Payer: Medicare Other | Admitting: Physical Therapy

## 2016-09-09 ENCOUNTER — Encounter: Payer: Medicare Other | Admitting: Physical Therapy

## 2016-09-12 ENCOUNTER — Encounter: Payer: Medicare Other | Admitting: Physical Therapy

## 2017-04-16 ENCOUNTER — Ambulatory Visit: Admit: 2017-04-16 | Payer: Medicare Other | Admitting: Internal Medicine

## 2017-04-16 SURGERY — CARDIOVERSION (CATH LAB)
Anesthesia: General

## 2017-09-30 IMAGING — DX DG CHEST 1V PORT
1 series · 1 of 1 positions shown · non-contrast
Comparison: None.

CLINICAL DATA: Dizziness after chiropractor visit. History of
hypertension, hyperlipidemia, CHF.

EXAM:
PORTABLE CHEST 1 VIEW

[chest ap]
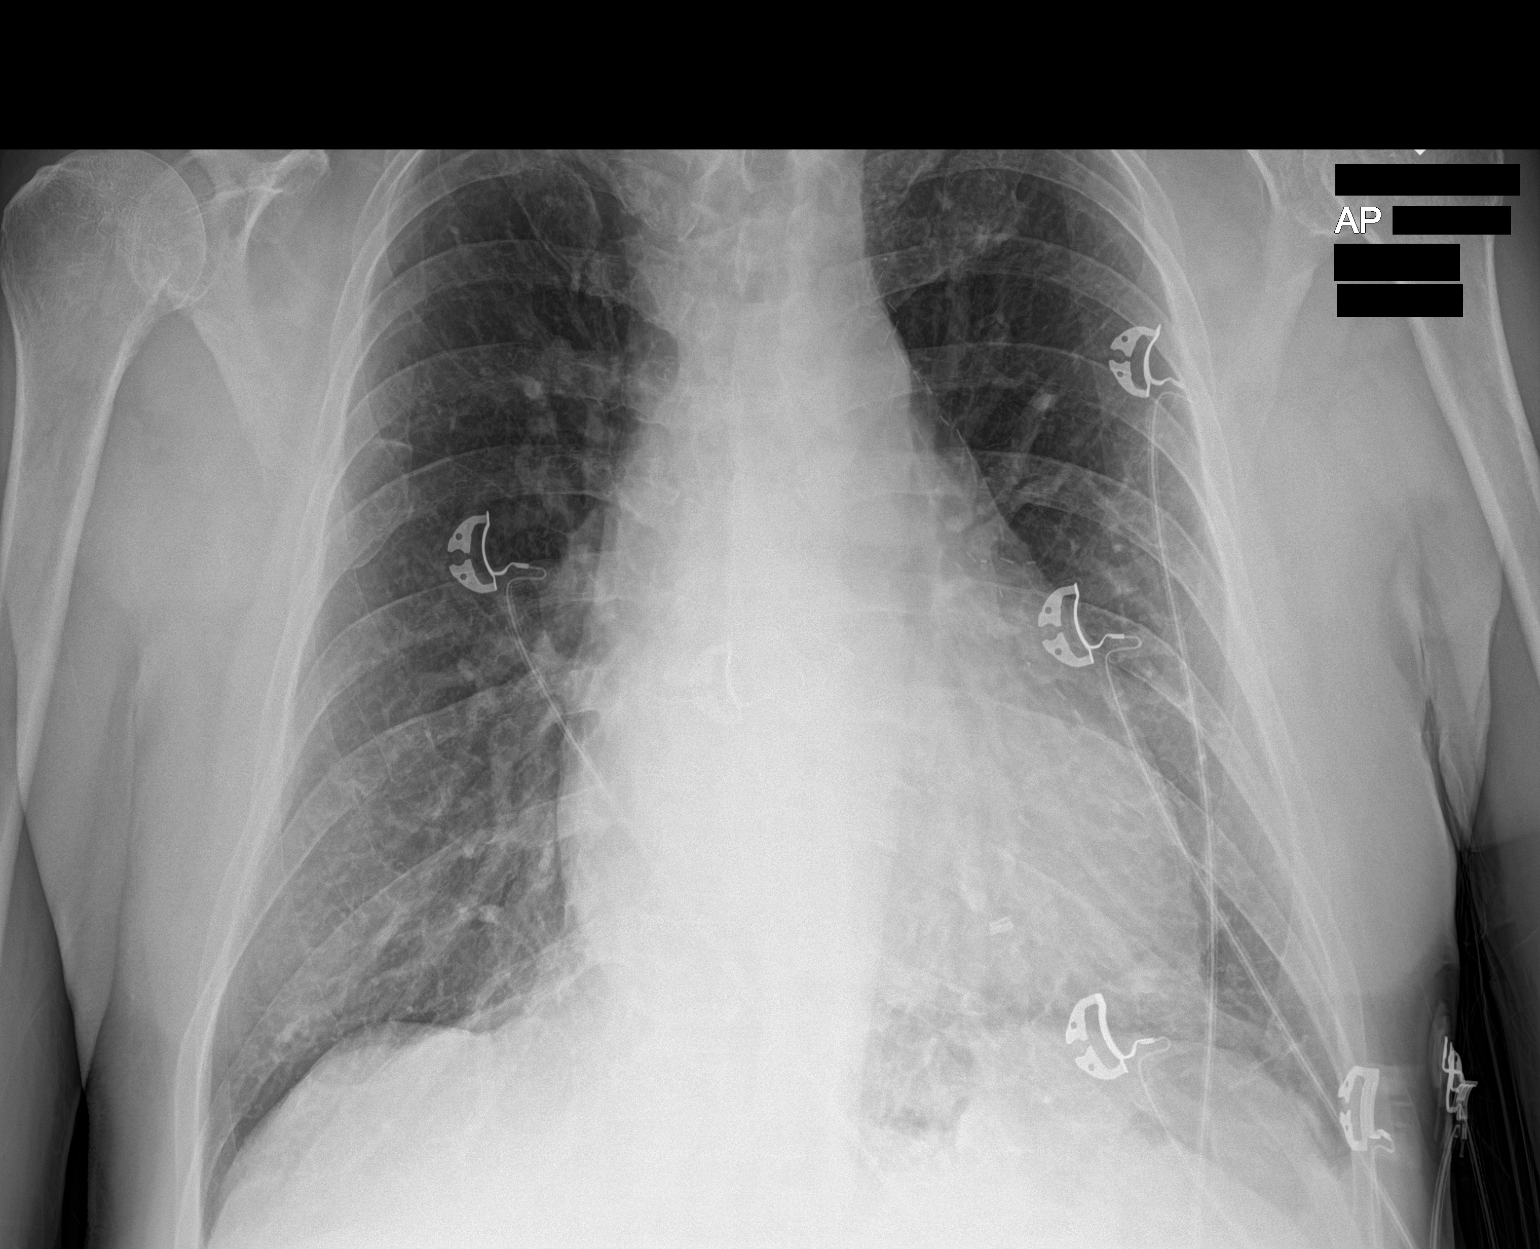

[1 of 1 positions shown; findings below may reference images not displayed]

FINDINGS: Cardiac silhouette is mild-to-moderately enlarged. Status post CABG.
Increased lung volumes with mild chronic interstitial changes, no
pleural effusion or focal consolidation. Apical pleural thickening.
No pneumothorax. Osteopenia. Soft tissue planes are normal.
IMPRESSION: Mild to moderate cardiomegaly.  COPD.

## 2019-03-04 ENCOUNTER — Ambulatory Visit: Admit: 2019-03-04 | Payer: Medicare Other | Admitting: Internal Medicine

## 2019-03-04 SURGERY — COLONOSCOPY WITH PROPOFOL
Anesthesia: General

## 2019-08-12 ENCOUNTER — Ambulatory Visit (INDEPENDENT_AMBULATORY_CARE_PROVIDER_SITE_OTHER): Payer: Medicare Other | Admitting: Dermatology

## 2019-08-12 ENCOUNTER — Other Ambulatory Visit: Payer: Self-pay

## 2019-08-12 DIAGNOSIS — Z1283 Encounter for screening for malignant neoplasm of skin: Secondary | ICD-10-CM | POA: Diagnosis not present

## 2019-08-12 DIAGNOSIS — L821 Other seborrheic keratosis: Secondary | ICD-10-CM | POA: Diagnosis not present

## 2019-08-12 DIAGNOSIS — L570 Actinic keratosis: Secondary | ICD-10-CM

## 2019-08-12 DIAGNOSIS — L578 Other skin changes due to chronic exposure to nonionizing radiation: Secondary | ICD-10-CM

## 2019-08-12 DIAGNOSIS — L820 Inflamed seborrheic keratosis: Secondary | ICD-10-CM

## 2019-08-12 DIAGNOSIS — L719 Rosacea, unspecified: Secondary | ICD-10-CM | POA: Diagnosis not present

## 2019-08-12 MED ORDER — METRONIDAZOLE 0.75 % EX GEL: 1.0000 "application " | Freq: Two times a day (BID) | CUTANEOUS | 11 refills | Status: DC

## 2019-08-12 MED ORDER — METRONIDAZOLE 0.75 % EX GEL: 1.0000 "application " | Freq: Two times a day (BID) | CUTANEOUS | 6 refills | Status: DC

## 2019-08-12 NOTE — Patient Instructions (Signed)
Rosacea  What is rosacea? Rosacea (say: ro-zay-sha) is a common skin disease that usually begins as a trend of flushing or blushing easily.  As rosacea progresses, a persistent redness in the center of the face will develop and may gradually spread beyond the nose and cheeks to the forehead and chin.  In some cases, the ears, chest, and back could be affected.  Rosacea may appear as tiny blood vessels or small red bumps that occur in crops.  Frequently they can contain pus, and are called "pustules".  If the bumps do not contain pus, they are referred to as "papules".  Rarely, in prolonged, untreated cases of rosacea, the oil glands of the nose and cheeks may become permanently enlarged.  This is called rhinophyma, and is seen more frequently in men.  Signs and Risks In its beginning stages, rosacea tends to come and go, which makes it difficult to recognize.  It can start as intermittent flushing of the face.  Eventually, blood vessels may become permanently visible.  Pustules and papules can appear, but can be mistaken for adult acne.  People of all races, ages, genders and ethnic groups are at risk of developing rosacea.  However, it is more common in women (especially around menopause) and adults with fair skin between the ages of 30 and 50.  Treatment Dermatologists typically recommend a combination of treatments to effectively manage rosacea.  Treatment can improve symptoms and may stop the progression of the rosacea.  Treatment may involve both topical and oral medications.  The tetracycline antibiotics are often used for their anti-inflammatory effect; however, because of the possibility of developing antibiotic resistance, they should not be used long term at full dose.  For dilated blood vessels the options include electrodessication (uses electric current through a small needle), laser treatment, and cosmetics to hide the redness.   With all forms of treatment, improvement is a slow process, and  patients may not see any results for the first 3-4 weeks.  It is very important to avoid the sun and other triggers.  Patients must wear sunscreen daily.  Skin Care Instructions: 1. Cleanse the skin with a mild soap such as CeraVe cleanser, Cetaphil cleanser, or Dove soap once or twice daily as needed. 2. Moisturize with Eucerin Redness Relief Daily Perfecting Lotion (has a subtle green tint), CeraVe Moisturizing Cream, or Oil of Olay Daily Moisturizer with sunscreen every morning and/or night as recommended. 3. Makeup should be "non-comedogenic" (won't clog pores) and be labeled "for sensitive skin". Good choices for cosmetics are: Neutrogena, Almay, and Physician's Formula.  Any product with a green tint tends to offset a red complexion. 4. If your eyes are dry and irritated, use artificial tears 2-3 times per day and cleanse the eyelids daily with baby shampoo.  Have your eyes examined at least every 2 years.  Be sure to tell your eye doctor that you have rosacea. 5. Alcoholic beverages tend to cause flushing of the skin, and may make rosacea worse. 6. Always wear sunscreen, protect your skin from extreme hot and cold temperatures, and avoid spicy foods, hot drinks, and mechanical irritation such as rubbing, scrubbing, or massaging the face.  Avoid harsh skin cleansers, cleansing masks, astringents, and exfoliation. If a particular product burns or makes your face feel tight, then it is likely to flare your rosacea. 7. If you are having difficulty finding a sunscreen that you can tolerate, you may try switching to a chemical-free sunscreen.  These are ones whose active   ingredient is zinc oxide or titanium dioxide only.  They should also be fragrance free, non-comedogenic, and labeled for sensitive skin. 8. Rosacea triggers may vary from person to person.  There are a variety of foods that have been reported to trigger rosacea.  Some patients find that keeping a diary of what they were doing when they  flared helps them avoid triggers.  

## 2019-08-12 NOTE — Progress Notes (Signed)
   Follow-Up Visit   Subjective  Sender Rueb is a 74 y.o. male who presents for the following: Actinic Keratosis (face > 49m f/u), itchy rash (back ~2wks, itchy, using TMC 0.1% cr prn), and ISK f/u (arms, hands, R pretibial, >37m f/u). The patient presents for Upper Body Skin Exam (UBSE) for skin cancer screening and mole check.   The following portions of the chart were reviewed this encounter and updated as appropriate:  Tobacco  Allergies  Meds  Problems  Med Hx  Surg Hx  Fam Hx      Review of Systems:  No other skin or systemic complaints except as noted in HPI or Assessment and Plan.  Objective  Well appearing patient in no apparent distress; mood and affect are within normal limits.  All skin waist up examined.  Objective  L back x 2, R arm x 2 (4): Erythematous keratotic or waxy stuck-on papule or plaque.   Objective  Right Ear x 1: Pink scaly macules    Assessment & Plan    Actinic Damage - diffuse scaly erythematous macules with underlying dyspigmentation - Recommend daily broad spectrum sunscreen SPF 30+ to sun-exposed areas, reapply every 2 hours as needed.  - Call for new or changing lesions.  Seborrheic Keratoses - Stuck-on, waxy, tan-brown papules and plaques  - Discussed benign etiology and prognosis. - Observe - Call for any changes  Skin cancer screening performed today.  Inflamed seborrheic keratosis (4) L back x 2, R arm x 2  Destruction of lesion - L back x 2, R arm x 2 Complexity: simple   Destruction method: cryotherapy   Informed consent: discussed and consent obtained   Timeout:  patient name, date of birth, surgical site, and procedure verified Lesion destroyed using liquid nitrogen: Yes   Region frozen until ice ball extended beyond lesion: Yes   Outcome: patient tolerated procedure well with no complications   Post-procedure details: wound care instructions given    Rosacea face  Start Metrogel 0.75% bid to cheeks and  nose  Reordered Medications metroNIDAZOLE (METROGEL) 0.75 % gel  AK (actinic keratosis) Right Ear x 1  Destruction of lesion - Right Ear x 1 Complexity: simple   Destruction method: cryotherapy   Informed consent: discussed and consent obtained   Timeout:  patient name, date of birth, surgical site, and procedure verified Lesion destroyed using liquid nitrogen: Yes   Region frozen until ice ball extended beyond lesion: Yes   Outcome: patient tolerated procedure well with no complications   Post-procedure details: wound care instructions given    Return in about 1 year (around 08/11/2020) for TBSE.  I, Othelia Pulling, RMA, am acting as scribe for Sarina Ser, MD .  Documentation: I have reviewed the above documentation for accuracy and completeness, and I agree with the above.  Sarina Ser, MD

## 2019-08-19 ENCOUNTER — Encounter: Payer: Self-pay | Admitting: Dermatology

## 2019-09-09 ENCOUNTER — Encounter: Payer: Self-pay | Admitting: Emergency Medicine

## 2019-09-09 ENCOUNTER — Other Ambulatory Visit: Payer: Self-pay

## 2019-09-09 ENCOUNTER — Emergency Department
Admission: EM | Admit: 2019-09-09 | Discharge: 2019-09-09 | Disposition: A | Discharge status: Home / Self Care | Payer: Medicare Other | Attending: Emergency Medicine | Admitting: Emergency Medicine

## 2019-09-09 DIAGNOSIS — E876 Hypokalemia: Secondary | ICD-10-CM | POA: Insufficient documentation

## 2019-09-09 DIAGNOSIS — I509 Heart failure, unspecified: Secondary | ICD-10-CM | POA: Insufficient documentation

## 2019-09-09 DIAGNOSIS — I110 Hypertensive heart disease with heart failure: Secondary | ICD-10-CM | POA: Diagnosis not present

## 2019-09-09 DIAGNOSIS — I471 Supraventricular tachycardia: Secondary | ICD-10-CM | POA: Insufficient documentation

## 2019-09-09 DIAGNOSIS — Z79899 Other long term (current) drug therapy: Secondary | ICD-10-CM | POA: Insufficient documentation

## 2019-09-09 DIAGNOSIS — D688 Other specified coagulation defects: Secondary | ICD-10-CM | POA: Diagnosis not present

## 2019-09-09 DIAGNOSIS — I2510 Atherosclerotic heart disease of native coronary artery without angina pectoris: Secondary | ICD-10-CM | POA: Insufficient documentation

## 2019-09-09 DIAGNOSIS — I4891 Unspecified atrial fibrillation: Secondary | ICD-10-CM | POA: Diagnosis not present

## 2019-09-09 DIAGNOSIS — Z7901 Long term (current) use of anticoagulants: Secondary | ICD-10-CM | POA: Insufficient documentation

## 2019-09-09 DIAGNOSIS — R002 Palpitations: Secondary | ICD-10-CM

## 2019-09-09 DIAGNOSIS — Z951 Presence of aortocoronary bypass graft: Secondary | ICD-10-CM | POA: Insufficient documentation

## 2019-09-09 DIAGNOSIS — R791 Abnormal coagulation profile: Secondary | ICD-10-CM

## 2019-09-09 LAB — CBC WITH DIFFERENTIAL/PLATELET
Abs Immature Granulocytes: 0.03 10*3/uL (ref 0.00–0.07)
Basophils Absolute: 0 10*3/uL (ref 0.0–0.1)
Basophils Relative: 1 %
Eosinophils Absolute: 0.1 10*3/uL (ref 0.0–0.5)
Eosinophils Relative: 1 %
HCT: 39.9 % (ref 39.0–52.0)
Hemoglobin: 13.1 g/dL (ref 13.0–17.0)
Immature Granulocytes: 0 %
Lymphocytes Relative: 12 %
Lymphs Abs: 1 10*3/uL (ref 0.7–4.0)
MCH: 28.8 pg (ref 26.0–34.0)
MCHC: 32.8 g/dL (ref 30.0–36.0)
MCV: 87.7 fL (ref 80.0–100.0)
Monocytes Absolute: 1.5 10*3/uL — ABNORMAL HIGH (ref 0.1–1.0)
Monocytes Relative: 19 %
Neutro Abs: 5.4 10*3/uL (ref 1.7–7.7)
Neutrophils Relative %: 67 %
Platelets: 119 10*3/uL — ABNORMAL LOW (ref 150–400)
RBC: 4.55 MIL/uL (ref 4.22–5.81)
RDW: 14.1 % (ref 11.5–15.5)
WBC: 8 10*3/uL (ref 4.0–10.5)
nRBC: 0 % (ref 0.0–0.2)

## 2019-09-09 LAB — BASIC METABOLIC PANEL
Anion gap: 11 (ref 5–15)
BUN: 16 mg/dL (ref 8–23)
CO2: 27 mmol/L (ref 22–32)
Calcium: 9.7 mg/dL (ref 8.9–10.3)
Chloride: 102 mmol/L (ref 98–111)
Creatinine, Ser: 0.95 mg/dL (ref 0.61–1.24)
GFR calc Af Amer: >60 mL/min (ref >60)
GFR calc non Af Amer: >60 mL/min (ref >60)
Glucose, Bld: 121 mg/dL — ABNORMAL HIGH (ref 70–99)
Potassium: 3.3 mmol/L — ABNORMAL LOW (ref 3.5–5.1)
Sodium: 140 mmol/L (ref 135–145)

## 2019-09-09 LAB — MAGNESIUM: Magnesium: 1.9 mg/dL (ref 1.7–2.4)

## 2019-09-09 LAB — PROTIME-INR
INR: 1.4 — ABNORMAL HIGH (ref 0.8–1.2)
Prothrombin Time: 16.7 seconds — ABNORMAL HIGH (ref 11.4–15.2)

## 2019-09-09 MED ORDER — DILTIAZEM HCL 25 MG/5ML IV SOLN
20.0000 mg | Freq: Once | INTRAVENOUS | Status: AC
  Administered 2019-09-09: 10 mg via INTRAVENOUS
  Filled 2019-09-09: qty 5

## 2019-09-09 MED ORDER — POTASSIUM CHLORIDE CRYS ER 20 MEQ PO TBCR
40.0000 meq | EXTENDED_RELEASE_TABLET | Freq: Once | ORAL | Status: AC
  Administered 2019-09-09: 40 meq via ORAL
  Filled 2019-09-09: qty 2

## 2019-09-09 MED ORDER — METOPROLOL TARTRATE 50 MG PO TABS
75.0000 mg | ORAL_TABLET | Freq: Once | ORAL | Status: AC
  Administered 2019-09-09: 75 mg via ORAL
  Filled 2019-09-09: qty 1

## 2019-09-09 MED ORDER — ADENOSINE 12 MG/4ML IV SOLN
12.0000 mg | Freq: Once | INTRAVENOUS | Status: AC
  Administered 2019-09-09: 12 mg via INTRAVENOUS
  Filled 2019-09-09: qty 4

## 2019-09-09 MED ORDER — WARFARIN SODIUM 5 MG PO TABS
5.0000 mg | ORAL_TABLET | Freq: Once | ORAL | Status: AC
  Administered 2019-09-09: 5 mg via ORAL
  Filled 2019-09-09: qty 1

## 2019-09-09 NOTE — ED Notes (Signed)
Pt presents w/ hx of svt, was seen at cardiologist today and referred to this ED. Pt placed on Zoll monitor at this time.

## 2019-09-09 NOTE — ED Triage Notes (Signed)
Patient ambulated by self to BR.

## 2019-09-09 NOTE — ED Triage Notes (Signed)
Patient presents to the ED from the cardiologist's office with SVT.  Patient states he noticed his heart beating quickly yesterday.  Patient states he feels winded when he can feel it fluttering, but otherwise feels fine.  Patient states he feels fine now.  Patient states he has been cardioverted once before for the same problem.

## 2019-09-09 NOTE — Discharge Instructions (Addendum)
You were seen in the ED because of the fluttering sensation in your chest, representing your heart moving too quickly, called SVT.  Your blood work showed a low INR of 1.4, and a slightly decreased potassium.  We gave you 5 mg of Coumadin and oral potassium supplement.  We were able to slow your heart down with medications, and did not need to shocky.  Please call your cardiologist to set up a follow-up appointment within the next 2-3 days to be seen soon as possible.  We gave you a dose of short acting metoprolol here in the ED at about 1 PM.  Please take your typical evening dose of metoprolol tonight and continue as normally prescribed  If you develop any fevers, chest pain, passing out or further fluttering that will not go away, please return to the ED

## 2019-09-09 NOTE — ED Triage Notes (Signed)
First Nurse Note:  Arrives from Dr. Charlotte Crumb office for ED evaluation of SVT.  Per report, office had tried to schedule an outpaitent Cardioversion for tomorrow, but were unable to due lack of room on schedule.  Arrives, AAOx3.  Skin warm and dry. NAD

## 2019-09-09 NOTE — ED Provider Notes (Signed)
[facility] Emergency Department Provider Note ____________________________________________   First MD Initiated Contact with Patient 09/09/19 1236     (approximate)  I have reviewed the triage vital signs and the nursing notes.  HISTORY  Chief Complaint Tachycardia   HPI Louis Cooper is a 74 y.o. male presents to the ED for evaluation of SVT.    History of HTN, HLD, CAD status post CABG, AVR and A. fib.  Anticoagulated on Coumadin.   Patient reports a supratherapeutic INR 3 days ago of 3.2 or 3.3, that he recalls.  He reports being told to stop his Coumadin for 2 days, and resume his Coumadin today.  He has not taken his Coumadin today or any of his morning medications.  Also reports not taking his metoprolol last night, which he typically takes twice daily.  Patient reports feeling intermittent fluttering sensations in his chest for the past few weeks since his metoprolol was decreased from 200 mg twice daily to 100 mg twice daily, this was done because of symptomatic bradycardia.  He takes metoprolol succinate twice daily.  Due to this fluttering sensation, he called his cardiologist, Dr. Nehemiah Massed, yesterday and he was briefly seen in the clinic earlier today in the morning prior to coming to the ED.  He was noted to be tachycardic in A. fib with RVR with rates in the 160s, and was advised to come to the ED for further evaluation.  Patient denies any fevers, syncope, chest pain, shortness of breath, worsening leg swelling or other acute complaints.  Reports intermittently feeling a fluttering sensation, but is otherwise well.  Past Medical History:  Diagnosis Date  . A-fib ([place])   . Actinic keratosis   . Arthritis   . Cataract   . CHF (congestive heart failure) ([place])   . GERD (gastroesophageal reflux disease)   . Gout   . Heart disease   . Hernia of abdominal wall   . History of heart valve replacement   . Hx of CABG   . Hyperlipidemia   .  Hyperlipidemia   . Hypertension     Patient Active Problem List   Diagnosis Date Noted  . 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 CARDIOVERSION N/A 04/02/2016   Procedure: CARDIOVERSION;  Surgeon: Corey Skains, MD;  Location: [facility] ORS;  Service: Cardiovascular;  Laterality: N/A;  . HERNIA REPAIR    . TEE WITHOUT CARDIOVERSION N/A 04/02/2016   Procedure: TRANSESOPHAGEAL ECHOCARDIOGRAM (TEE);  Surgeon: Corey Skains, MD;  Location: [facility] ORS;  Service: Cardiovascular;  Laterality: N/A;  . VALVE REPLACEMENT      Prior to Admission medications   Medication Sig Start Date End Date Taking? Authorizing Provider  omeprazole (PRILOSEC) 40 MG capsule Take 40 mg by mouth 2 (two) times daily before a meal. 03/09/18  Yes [provider]  atorvastatin (LIPITOR) 40 MG tablet Take 40 mg by mouth every evening.  09/25/15   [provider]  B Complex-C (B-COMPLEX WITH VITAMIN C) tablet Take 1 tablet by mouth daily after breakfast.    [provider]  cetaphil (CETAPHIL) cream Apply 1 application topically as needed (moisture).    [provider]  Cholecalciferol (VITAMIN D3) 2000 units TABS Take 2,000 Units by mouth daily after breakfast.    [provider]  Coenzyme Q10 (CO Q10) 100 MG CAPS Take 100 mg by mouth every other day.    [provider]  ibuprofen (ADVIL,MOTRIN) 200  MG tablet Take 200 mg by mouth at bedtime.    [provider]  metoprolol succinate (TOPROL-XL) 100 MG 24 hr tablet Take 100 mg by mouth daily. 08/07/19   [provider]  metroNIDAZOLE (METROGEL) 0.75 % gel Apply 1 application topically in the morning and at bedtime. Thin coat to cheeks and nose bid for rosacea 08/12/19 08/11/20  Ralene Bathe, MD  MULTAQ 400 MG tablet Take 400 mg by mouth 2 (two) times daily. 03/26/17   [provider]  Multiple Vitamin (MULTIVITAMIN WITH MINERALS) TABS tablet Take 1 tablet by  mouth daily.    [provider]  tamsulosin (FLOMAX) 0.4 MG CAPS capsule Take 0.4 mg by mouth daily.     [provider]  warfarin (COUMADIN) 1 MG tablet Take 1 mg by mouth every evening. 04/02/17   [provider]  warfarin (COUMADIN) 5 MG tablet Take 5 mg by mouth every evening.    [provider]    Allergies Patient has no known allergies.  No family history on file.  Social History Social History   Tobacco Use  . Smoking status: Never Smoker  . Smokeless tobacco: Never Used  Substance Use Topics  . Alcohol use: No  . Drug use: Not on file    Review of Systems  Constitutional: No fever/chills Eyes: No visual changes. ENT: No sore throat. Cardiovascular: Denies chest pain.  Positive for palpitations Respiratory: Denies shortness of breath. Gastrointestinal: No abdominal pain.  No nausea, no vomiting.  No diarrhea.  No constipation. Genitourinary: Negative for dysuria. Musculoskeletal: Negative for back pain. Skin: Negative for rash. Neurological: Negative for headaches, focal weakness or numbness.   ____________________________________________   PHYSICAL EXAM:  VITAL SIGNS: ED Triage Vitals  Enc Vitals Group     BP      Pulse      Resp      Temp      Temp src      SpO2      Weight      Height      Head Circumference      Peak Flow      Pain Score      Pain Loc      Pain Edu?      Excl. in [place]?      Constitutional: Alert and oriented. Well appearing and in no acute distress.  Conversational in full sentences, pleasant and joking Eyes: Conjunctivae are normal. PERRL. EOMI. Head: Atraumatic. Nose: No congestion/rhinnorhea. Mouth/Throat: Mucous membranes are moist.  Oropharynx non-erythematous. Neck: No stridor. No cervical spine tenderness to palpation. Cardiovascular: Tachycardic and irregular. Grossly normal heart sounds.  Good peripheral circulation. Respiratory: Normal respiratory effort.  No retractions. Lungs  CTAB. Gastrointestinal: Soft , nondistended, nontender to palpation. No abdominal bruits. No CVA tenderness. Musculoskeletal: No lower extremity tenderness nor edema.  No joint effusions. No signs of acute trauma. Neurologic:  Normal speech and language. No gross focal neurologic deficits are appreciated. No gait instability noted. Skin:  Skin is warm, dry and intact. No rash noted. Psychiatric: Mood and affect are normal. Speech and behavior are normal.  ____________________________________________   LABS (all labs ordered are listed, but only abnormal results are displayed)  Labs Reviewed  CBC WITH DIFFERENTIAL/PLATELET - Abnormal; Notable for the following components:      Result Value   Platelets 119 (*)    Monocytes Absolute 1.5 (*)    All other components within normal limits  BASIC METABOLIC PANEL - Abnormal;  Notable for the following components:   Potassium 3.3 (*)    Glucose, Bld 121 (*)    All other components within normal limits  PROTIME-INR - Abnormal; Notable for the following components:   Prothrombin Time 16.7 (*)    INR 1.4 (*)    All other components within normal limits  MAGNESIUM   ____________________________________________  12 Lead EKG  Twelve-lead EKG obtained at 1231 shows SVT with rate of 168 bpm, normal axis, 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  ____________________________________________  RADIOLOGY  ____________________________________________   PROCEDURES  Procedure(s) performed (including Critical Care):  Procedures  CRITICAL CARE Performed by: Vladimir Crofts   Total critical care time: 40 minutes  Critical care time was exclusive of separately billable procedures and treating other patients.  Critical care was necessary to treat or prevent imminent or life-threatening deterioration.  Critical care was time spent personally by me on the following activities: development of  treatment plan with patient and/or surrogate as well as nursing, discussions with consultants, evaluation of patient's response to treatment, examination of patient, obtaining history from patient or surrogate, ordering and performing treatments and interventions, ordering and review of laboratory studies, ordering and review of radiographic studies, pulse oximetry and re-evaluation of patient's condition.  ____________________________________________   INITIAL IMPRESSION / ASSESSMENT AND PLAN / ED COURSE  Pleasant 74 year old gentleman with history of A. fib presenting in SVT, found to have evidence of rapid A. fib with RVR, amenable to chemical cardioversion and subsequent discharge home.  Patient presented with rates of 168 bpm, with good blood pressures and no evidence of chest pain or endorgan dysfunction.  Blood work demonstrates subtherapeutic INR and hypokalemia, which were repleted orally.  Due to the subtherapeutic INR, spoke with cardiologist on-call who recommends IV diltiazem for medical cardioversion instead of electrical cardioversion.  Patient successfully converted with a single dose of IV diltiazem as well as single dose of p.o. metoprolol.  Patient has no chest pain throughout any of this and no ischemic changes on his EKG.  He is well-established with cardiology and plans to follow-up in the next 2-3 days.  Return precautions for the ED were discussed, patient is medically stable for discharge home.  Clinical Course as of Sep 09 2211  Thu Sep 09, 2019  1313 Attempted chemical cardioversion with 12 mg of adenosine IV rapid push, immediately went back into SVT with rates in the 160s.   [DS]  9562 [id number].  Patient continues to feel well and has no chest pain, presyncope or shortness of breath.  Rates remain at 168 bpm.  He was just provided p.o. metoprolol.  We will allow this to to assist before cardioverting.   [DS]  1345 Subtherapeutic INR and hypokalemia.  Repletion ordered    [DS]  1354 Spoke with [facility] cards on call, Dr. Ubaldo Glassing, explained workup and presentation. Recommends bolus of IV diltiazem   [DS]  9629 Patient's rates are decreasing after metoprolol to the 120s.  IV diltiazem 10 mg reduces rates to 50s-60s.  Patient continues to feel well.  Educated patient and daughter that if he continues to have slow rates, it would be reasonable to go home this afternoon and follow-up with Dr. Nehemiah Massed in the clinic within the next 3 days.   [DS]  1458 Rate remaining in the low 80s.   [DS]    Clinical Course User Index [DS] Vladimir Crofts, MD     ____________________________________________   FINAL CLINICAL IMPRESSION(S) /  ED DIAGNOSES  Final diagnoses:  SVT (supraventricular tachycardia) (HCC)  Atrial fibrillation with RVR (HCC)  Palpitations  Hypokalemia  Subtherapeutic international normalized ratio (INR)     ED Discharge Orders    None       Myrta Mercer   Note:  This document was prepared using Set designer software and may include unintentional dictation errors.   Vladimir Crofts, MD 09/09/19 2215

## 2020-01-03 ENCOUNTER — Other Ambulatory Visit: Payer: Self-pay

## 2020-01-03 ENCOUNTER — Other Ambulatory Visit
Admission: RE | Admit: 2020-01-03 | Discharge: 2020-01-03 | Disposition: A | Discharge status: Home / Self Care | Payer: Medicare Other | Source: Ambulatory Visit | Attending: Internal Medicine | Admitting: Internal Medicine

## 2020-01-03 DIAGNOSIS — Z01812 Encounter for preprocedural laboratory examination: Secondary | ICD-10-CM | POA: Diagnosis present

## 2020-01-03 DIAGNOSIS — Z20822 Contact with and (suspected) exposure to covid-19: Secondary | ICD-10-CM | POA: Diagnosis not present

## 2020-01-04 LAB — SARS CORONAVIRUS 2 (TAT 6-24 HRS): SARS Coronavirus 2: NEGATIVE

## 2020-01-05 ENCOUNTER — Ambulatory Visit: Payer: Medicare Other | Admitting: Anesthesiology

## 2020-01-05 ENCOUNTER — Ambulatory Visit
Admission: RE | Admit: 2020-01-05 | Discharge: 2020-01-05 | Disposition: A | Discharge status: Home / Self Care | Payer: Medicare Other | Attending: Internal Medicine | Admitting: Internal Medicine

## 2020-01-05 ENCOUNTER — Encounter: Payer: Self-pay | Admitting: Internal Medicine

## 2020-01-05 ENCOUNTER — Encounter
Admission: RE | Disposition: A | Discharge status: Home / Self Care | Payer: Self-pay | Source: Home / Self Care | Attending: Internal Medicine

## 2020-01-05 ENCOUNTER — Other Ambulatory Visit: Payer: Self-pay

## 2020-01-05 DIAGNOSIS — Z951 Presence of aortocoronary bypass graft: Secondary | ICD-10-CM | POA: Diagnosis not present

## 2020-01-05 DIAGNOSIS — Z8249 Family history of ischemic heart disease and other diseases of the circulatory system: Secondary | ICD-10-CM | POA: Insufficient documentation

## 2020-01-05 DIAGNOSIS — Z79899 Other long term (current) drug therapy: Secondary | ICD-10-CM | POA: Insufficient documentation

## 2020-01-05 DIAGNOSIS — Z7901 Long term (current) use of anticoagulants: Secondary | ICD-10-CM | POA: Diagnosis not present

## 2020-01-05 DIAGNOSIS — I480 Paroxysmal atrial fibrillation: Secondary | ICD-10-CM | POA: Insufficient documentation

## 2020-01-05 DIAGNOSIS — Z952 Presence of prosthetic heart valve: Secondary | ICD-10-CM | POA: Diagnosis not present

## 2020-01-05 HISTORY — PX: CARDIOVERSION: SHX1299

## 2020-01-05 LAB — GLUCOSE, CAPILLARY: Glucose-Capillary: 126 mg/dL — ABNORMAL HIGH (ref 70–99)

## 2020-01-05 SURGERY — CARDIOVERSION
Anesthesia: General

## 2020-01-05 MED ORDER — SODIUM CHLORIDE 0.9 % IV SOLN: INTRAVENOUS | Status: DC

## 2020-01-05 MED ORDER — PROPOFOL 10 MG/ML IV BOLUS
INTRAVENOUS | Status: DC | PRN
  Administered 2020-01-05: 20 mg via INTRAVENOUS
  Administered 2020-01-05: 50 mg via INTRAVENOUS

## 2020-01-05 NOTE — CV Procedure (Signed)
Electrical Cardioversion Procedure Note Louis Cooper [id number] 08/02/1945  Procedure: Electrical Cardioversion Indications:  Paroxysmal non valvular atrial fibrillation  Procedure Details Consent: Risks of procedure as well as the alternatives and risks of each were explained to the (patient/caregiver).  Consent for procedure obtained. Time Out: Verified patient identification, verified procedure, site/side was marked, verified correct patient position, special equipment/implants available, medications/allergies/relevent history reviewed, required imaging and test results available.  Performed  Patient placed on cardiac monitor, pulse oximetry, supplemental oxygen as necessary.  Sedation given: Propofol and versed as per anesthesia  Pacer pads placed anterior and posterior chest.  Cardioverted 1 time(s).  Cardioverted at 120J.  Evaluation Findings: Post procedure EKG shows: NSR Complications: None Patient did  tolerate procedure well.   Louis Cooper M.D. [facility] 01/05/2020, 8:06 AM

## 2020-01-05 NOTE — Transfer of Care (Signed)
Immediate Anesthesia Transfer of Care Note  Patient: Gerhardt Gleed  Procedure(s) Performed: CARDIOVERSION (N/A )  Patient Location: Cath Lab  Anesthesia Type:General  Level of Consciousness: awake, drowsy and patient cooperative  Airway & Oxygen Therapy: Patient Spontanous Breathing and Patient connected to nasal cannula oxygen  Post-op Assessment: Report given to RN and Post -op Vital signs reviewed and stable  Post vital signs: Reviewed and stable  Last Vitals:  Vitals Value Taken Time  BP 137/74 01/05/20 0804  Temp    Pulse 65 01/05/20 0805  Resp 16 01/05/20 0805  SpO2 98 % 01/05/20 0805    Last Pain:  Vitals:   01/05/20 0735  TempSrc: Oral  PainSc: 0-No pain         Complications: No complications documented.

## 2020-01-05 NOTE — Anesthesia Preprocedure Evaluation (Signed)
Anesthesia Evaluation  Patient identified by MRN, date of birth, ID band Patient awake    Reviewed: Allergy & Precautions, H&P , NPO status , Patient's Chart, lab work & pertinent test results  History of Anesthesia Complications Negative for: history of anesthetic complications  Airway Mallampati: III  TM Distance: >3 FB     Dental  (+) Upper Dentures   Pulmonary neg pulmonary ROS, neg sleep apnea, neg COPD,    breath sounds clear to auscultation       Cardiovascular hypertension, (-) angina+CHF  (-) Past MI and (-) Cardiac Stents + dysrhythmias + Valvular Problems/Murmurs (s/p AVR x 2)  Rhythm:irregular Rate:Normal     Neuro/Psych negative neurological ROS  negative psych ROS   GI/Hepatic Neg liver ROS, GERD  Controlled,  Endo/Other  negative endocrine ROS  Renal/GU negative Renal ROS  negative genitourinary   Musculoskeletal   Abdominal   Peds  Hematology negative hematology ROS (+)   Anesthesia Other Findings Past Medical History: No date: A-fib (HCC) No date: Actinic keratosis No date: Arthritis No date: Cataract No date: CHF (congestive heart failure) (HCC) No date: GERD (gastroesophageal reflux disease) No date: Gout No date: Heart disease No date: Hernia of abdominal wall No date: History of heart valve replacement 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 No date: Hyperlipidemia No date: Hyperlipidemia No date: Hypertension  Past Surgical History: 04/02/2016: CARDIOVERSION; N/A     Comment:  Procedure: CARDIOVERSION;  Surgeon: Corey Skains,               MD;  Location: [facility] ORS;  Service: Cardiovascular;                Laterality: N/A; No date: HERNIA REPAIR 04/02/2016: TEE WITHOUT CARDIOVERSION; N/A     Comment:  Procedure: TRANSESOPHAGEAL ECHOCARDIOGRAM (TEE);                Surgeon: Corey Skains, MD;  Location: [facility] ORS;                Service: Cardiovascular;  Laterality: N/A; No date: VALVE  REPLACEMENT     Reproductive/Obstetrics negative OB ROS                             Anesthesia Physical Anesthesia Plan  ASA: III  Anesthesia Plan: General   Post-op Pain Management:    Induction:   PONV Risk Score and Plan:   Airway Management Planned: Nasal Cannula  Additional Equipment:   Intra-op Plan:   Post-operative Plan:   Informed Consent: I have reviewed the patients History and Physical, chart, labs and discussed the procedure including the risks, benefits and alternatives for the proposed anesthesia with the patient or authorized representative who has indicated his/her understanding and acceptance.     Dental Advisory Given  Plan Discussed with: Anesthesiologist, CRNA and Surgeon  Anesthesia Plan Comments:         Anesthesia Quick Evaluation

## 2020-01-05 NOTE — Anesthesia Procedure Notes (Signed)
Procedure Name: General with mask airway Performed by: Fletcher-Harrison, Janesa Dockery, CRNA Pre-anesthesia Checklist: Patient identified, Emergency Drugs available, Suction available and Patient being monitored Patient Re-evaluated:Patient Re-evaluated prior to induction Oxygen Delivery Method: Simple face mask Induction Type: IV induction Placement Confirmation: positive ETCO2 and CO2 detector Dental Injury: Teeth and Oropharynx as per pre-operative assessment        

## 2020-01-06 NOTE — Anesthesia Postprocedure Evaluation (Signed)
Anesthesia Post Note  Patient: Louis Cooper  Procedure(s) Performed: CARDIOVERSION (N/A )  Patient location during evaluation: PACU Anesthesia Type: General Level of consciousness: awake and alert Pain management: pain level controlled Vital Signs Assessment: post-procedure vital signs reviewed and stable Respiratory status: spontaneous breathing, nonlabored ventilation and respiratory function stable Cardiovascular status: blood pressure returned to baseline and stable Postop Assessment: no apparent nausea or vomiting Anesthetic complications: no   No complications documented.   Last Vitals:  Vitals:   01/05/20 0820 01/05/20 0840  BP: 120/66 134/73  Pulse: 70 72  Resp: 18 17  Temp:    SpO2: 96% 97%    Last Pain:  Vitals:   01/05/20 0840  TempSrc:   PainSc: 0-No pain                 Brett Canales Alonda Weaber

## 2020-04-18 ENCOUNTER — Ambulatory Visit
Admission: RE | Admit: 2020-04-18 | Discharge: 2020-04-18 | Disposition: A | Discharge status: Home / Self Care | Payer: Medicare Other | Attending: Internal Medicine | Admitting: Internal Medicine

## 2020-04-18 ENCOUNTER — Encounter: Payer: Self-pay | Admitting: Internal Medicine

## 2020-04-18 ENCOUNTER — Ambulatory Visit: Payer: Medicare Other | Admitting: Anesthesiology

## 2020-04-18 ENCOUNTER — Encounter
Admission: RE | Disposition: A | Discharge status: Home / Self Care | Payer: Self-pay | Source: Home / Self Care | Attending: Internal Medicine

## 2020-04-18 ENCOUNTER — Other Ambulatory Visit: Payer: Self-pay

## 2020-04-18 DIAGNOSIS — Z79899 Other long term (current) drug therapy: Secondary | ICD-10-CM | POA: Insufficient documentation

## 2020-04-18 DIAGNOSIS — I480 Paroxysmal atrial fibrillation: Secondary | ICD-10-CM | POA: Insufficient documentation

## 2020-04-18 DIAGNOSIS — Z7901 Long term (current) use of anticoagulants: Secondary | ICD-10-CM | POA: Diagnosis not present

## 2020-04-18 HISTORY — PX: CARDIOVERSION: SHX1299

## 2020-04-18 SURGERY — CARDIOVERSION
Anesthesia: General

## 2020-04-18 MED ORDER — PROPOFOL 10 MG/ML IV BOLUS
INTRAVENOUS | Status: DC | PRN
  Administered 2020-04-18: 60 mg via INTRAVENOUS

## 2020-04-18 MED ORDER — SODIUM CHLORIDE 0.9 % IV SOLN: INTRAVENOUS | Status: DC | PRN

## 2020-04-18 NOTE — Anesthesia Procedure Notes (Signed)
Performed by: Aidyn Sportsman, CRNA Pre-anesthesia Checklist: Patient identified, Emergency Drugs available, Suction available, Patient being monitored and Timeout performed Oxygen Delivery Method: Nasal cannula       

## 2020-04-18 NOTE — Transfer of Care (Signed)
Immediate Anesthesia Transfer of Care Note  Patient: Louis Cooper  Procedure(s) Performed: CARDIOVERSION (N/A )  Patient Location: PACU  Anesthesia Type:General  Level of Consciousness: awake, alert  and oriented  Airway & Oxygen Therapy: Patient Spontanous Breathing and Patient connected to nasal cannula oxygen  Post-op Assessment: Report given to RN and Post -op Vital signs reviewed and stable  Post vital signs: Reviewed and stable  Last Vitals:  Vitals Value Taken Time  BP 138/83 04/18/20 0751  Temp    Pulse 81 04/18/20 0754  Resp 21 04/18/20 0754  SpO2 95 % 04/18/20 0754  Vitals shown include unvalidated device data.  Last Pain:  Vitals:   04/18/20 0706  TempSrc: Oral  PainSc: 1          Complications: No complications documented.

## 2020-04-18 NOTE — CV Procedure (Signed)
Electrical Cardioversion Procedure Note Louis Cooper [id number] [birth year]  Procedure: Electrical Cardioversion Indications:  Paroxysmal non valvular atrial fibrillation  Procedure Details Consent: Risks of procedure as well as the alternatives and risks of each were explained to the (patient/caregiver).  Consent for procedure obtained. Time Out: Verified patient identification, verified procedure, site/side was marked, verified correct patient position, special equipment/implants available, medications/allergies/relevent history reviewed, required imaging and test results available.  Performed  Patient placed on cardiac monitor, pulse oximetry, supplemental oxygen as necessary.  Sedation given: Propofol and versed as per anesthesia  Pacer pads placed anterior and posterior chest.  Cardioverted 1 time(s).  Cardioverted at 120J.  Evaluation Findings: Post procedure EKG shows: NSR Complications: None Patient did tolerate procedure well.   Serafina Royals M.D. [facility] 04/18/2020, 7:50 AM

## 2020-04-18 NOTE — Anesthesia Preprocedure Evaluation (Addendum)
Anesthesia Evaluation  Patient identified by MRN, date of birth, ID band Patient awake    Reviewed: Allergy & Precautions, H&P , NPO status , Patient's Chart, lab work & pertinent test results  History of Anesthesia Complications Negative for: history of anesthetic complications  Airway Mallampati: III  TM Distance: >3 FB     Dental  (+) Upper Dentures, Dental Advidsory Given   Pulmonary neg shortness of breath, neg sleep apnea, neg COPD, Recent URI  (Covid recently), Resolved,    breath sounds clear to auscultation       Cardiovascular hypertension, (-) angina+CHF  (-) Past MI and (-) Cardiac Stents + dysrhythmias + Valvular Problems/Murmurs (s/p AVR x 2)  Rhythm:irregular Rate:Normal     Neuro/Psych negative neurological ROS  negative psych ROS   GI/Hepatic Neg liver ROS, GERD  Controlled,  Endo/Other  negative endocrine ROS  Renal/GU negative Renal ROS  negative genitourinary   Musculoskeletal   Abdominal   Peds  Hematology negative hematology ROS (+)   Anesthesia Other Findings Past Medical History: No date: A-fib (HCC) No date: Actinic keratosis No date: Arthritis No date: Cataract No date: CHF (congestive heart failure) (HCC) No date: GERD (gastroesophageal reflux disease) No date: Gout No date: Heart disease No date: Hernia of abdominal wall No date: History of heart valve replacement 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 No date: Hyperlipidemia No date: Hyperlipidemia No date: Hypertension  Past Surgical History: 04/02/2016: CARDIOVERSION; N/A     Comment:  Procedure: CARDIOVERSION;  Surgeon: Corey Skains,               MD;  Location: [facility];  Service: Cardiovascular;                Laterality: N/A; No date: HERNIA REPAIR 04/02/2016: TEE WITHOUT CARDIOVERSION; N/A     Comment:  Procedure: TRANSESOPHAGEAL ECHOCARDIOGRAM (TEE);                Surgeon: Corey Skains, MD;  Location: [facility];                 Service: Cardiovascular;  Laterality: N/A; No date: VALVE REPLACEMENT     Reproductive/Obstetrics negative OB ROS                            Anesthesia Physical  Anesthesia Plan  ASA: III  Anesthesia Plan: General   Post-op Pain Management:    Induction: Intravenous  PONV Risk Score and Plan: TIVA and Propofol infusion  Airway Management Planned: Nasal Cannula  Additional Equipment:   Intra-op Plan:   Post-operative Plan:   Informed Consent: I have reviewed the patients History and Physical, chart, labs and discussed the procedure including the risks, benefits and alternatives for the proposed anesthesia with the patient or authorized representative who has indicated his/her understanding and acceptance.     Dental Advisory Given  Plan Discussed with: Anesthesiologist, CRNA and Surgeon  Anesthesia Plan Comments:         Anesthesia Quick Evaluation

## 2020-04-18 NOTE — Anesthesia Postprocedure Evaluation (Signed)
Anesthesia Post Note  Patient: Louis Cooper  Procedure(s) Performed: CARDIOVERSION (N/A )  Patient location during evaluation: PACU Anesthesia Type: General Level of consciousness: awake and alert Pain management: pain level controlled Vital Signs Assessment: post-procedure vital signs reviewed and stable Respiratory status: spontaneous breathing, nonlabored ventilation, respiratory function stable and patient connected to nasal cannula oxygen Cardiovascular status: blood pressure returned to baseline and stable Postop Assessment: no apparent nausea or vomiting Anesthetic complications: no   No complications documented.   Last Vitals:  Vitals:   04/18/20 0753 04/18/20 0800  BP:  127/75  Pulse: 80 80  Resp: (!) 21 17  SpO2: 96% 93%    Last Pain:  Vitals:   04/18/20 0706  TempSrc: Oral  PainSc: 1                  Martha Clan

## 2020-04-19 ENCOUNTER — Encounter: Payer: Self-pay | Admitting: Internal Medicine

## 2020-05-27 ENCOUNTER — Ambulatory Visit
Admission: EM | Admit: 2020-05-27 | Discharge: 2020-05-27 | Disposition: A | Discharge status: Home / Self Care | Payer: Medicare Other | Attending: Family Medicine | Admitting: Family Medicine

## 2020-05-27 ENCOUNTER — Encounter: Payer: Self-pay | Admitting: Emergency Medicine

## 2020-05-27 ENCOUNTER — Other Ambulatory Visit: Payer: Self-pay

## 2020-05-27 DIAGNOSIS — L03116 Cellulitis of left lower limb: Secondary | ICD-10-CM

## 2020-05-27 MED ORDER — DOXYCYCLINE HYCLATE 100 MG PO CAPS: 100.0000 mg | ORAL_CAPSULE | Freq: Two times a day (BID) | ORAL | 0 refills | Status: DC

## 2020-05-27 MED ORDER — MUPIROCIN 2 % EX OINT: 1.0000 "application " | TOPICAL_OINTMENT | Freq: Three times a day (TID) | CUTANEOUS | 0 refills | Status: AC

## 2020-05-27 NOTE — Discharge Instructions (Addendum)
Medication as prescribed.  Take care  Dr. Brienna Bass  

## 2020-05-27 NOTE — ED Provider Notes (Signed)
[facility]    CSN: [id number] Arrival date & time: 05/27/20  1037      History   Chief Complaint Chief Complaint  Patient presents with  . Insect Bite    Left upper leg   HPI   75 year old male presents with the above complaint.  Started 2 days ago.  Patient reports an area of redness and tenderness to the left lateral distal thigh.  He is concerned he may have gotten bit by an insect but does not recall an actual bite.  He states that it has been itching and he has been tender as well as red.  No fever.  Pain currently 3/10 in severity.  No other complaints.  Past Medical History:  Diagnosis Date  . A-fib ([place])   . Actinic keratosis   . Arthritis   . Cataract   . CHF (congestive heart failure) ([place])   . GERD (gastroesophageal reflux disease)   . Gout   . Heart disease   . Hernia of abdominal wall   . History of heart valve replacement   . Hx of CABG   . Hyperlipidemia   . Hyperlipidemia   . Hypertension     Patient Active Problem List   Diagnosis Date Noted  . 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 CARDIOVERSION N/A 04/02/2016   Procedure: CARDIOVERSION;  Surgeon: Corey Skains, MD;  Location: [facility];  Service: Cardiovascular;  Laterality: N/A;  . CARDIOVERSION N/A 01/05/2020   Procedure: CARDIOVERSION;  Surgeon: Corey Skains, MD;  Location: [facility];  Service: Cardiovascular;  Laterality: N/A;  . CARDIOVERSION N/A 04/18/2020   Procedure: CARDIOVERSION;  Surgeon: Corey Skains, MD;  Location: [facility];  Service: Cardiovascular;  Laterality: N/A;  . HERNIA REPAIR    . TEE WITHOUT CARDIOVERSION N/A 04/02/2016   Procedure: TRANSESOPHAGEAL ECHOCARDIOGRAM (TEE);  Surgeon: Corey Skains, MD;  Location: [facility];  Service: Cardiovascular;  Laterality: N/A;  . VALVE REPLACEMENT         Home Medications    Prior to Admission medications   Medication Sig Start Date End Date Taking? Authorizing  Provider  amiodarone (PACERONE) 200 MG tablet Take 200 mg by mouth 2 (two) times daily.   Yes [provider]  apixaban (ELIQUIS) 5 MG TABS tablet Take 5 mg by mouth 2 (two) times daily.   Yes [provider]  atorvastatin (LIPITOR) 40 MG tablet Take 40 mg by mouth daily. 09/25/15  Yes [provider]  Cholecalciferol (VITAMIN D3) 2000 units TABS Take 4,000 Units by mouth daily.    Yes [provider]  doxycycline (VIBRAMYCIN) 100 MG capsule Take 1 capsule (100 mg total) by mouth 2 (two) times daily. 05/27/20  Yes Meshelle Holness G, DO  metoprolol succinate (TOPROL-XL) 25 MG 24 hr tablet Take 75 mg by mouth daily.   Yes [provider]  Multiple Vitamin (MULTIVITAMIN WITH MINERALS) TABS tablet Take 1 tablet by mouth daily.   Yes [provider]  mupirocin ointment (BACTROBAN) 2 % Apply 1 application topically 3 (three) times daily for 7 days. 05/27/20 06/03/20 Yes Rhen Dossantos G, DO  tamsulosin (FLOMAX) 0.4 MG CAPS capsule Take 0.4 mg by mouth every evening.   Yes [provider]  b complex vitamins capsule Take 2 capsules by mouth daily. Patient not taking: Reported on 04/18/2020    [provider]  bisacodyl (DULCOLAX) 5 MG EC tablet Take 5 mg  by mouth daily as needed for moderate constipation.    [provider]  cetaphil (CETAPHIL) cream Apply 1 application topically as needed (moisture).    [provider]  metroNIDAZOLE (METROGEL) 0.75 % gel Apply 1 application topically in the morning and at bedtime. Thin coat to cheeks and nose bid for rosacea Patient not taking: Reported on 04/18/2020 08/12/19 08/11/20  Ralene Bathe, MD  omeprazole (PRILOSEC) 40 MG capsule Take 40 mg by mouth daily as needed (heartburn).  03/09/18   [provider]  polyethylene glycol (MIRALAX / GLYCOLAX) 17 g packet Take 17 g by mouth every other day as needed (constipation). Patient not taking: Reported on 04/18/2020    [provider]  Sodium Chloride-Sodium Bicarb (NETI POT SINUS [place] NA) Place 1 Dose into the nose daily as needed (congestion).    [provider]   Social History Social History   Tobacco Use  . Smoking status: Never Smoker  . Smokeless tobacco: Never Used  Vaping Use  . Vaping Use: Never used  Substance Use Topics  . Alcohol use: No     Allergies   Multaq [dronedarone]   Review of Systems Review of Systems  Constitutional: Negative.   Skin:       Area of redness and tenderness, left thigh.   Physical Exam Triage Vital Signs ED Triage Vitals  Enc Vitals Group     BP 05/27/20 1050 (!) 163/87     Pulse Rate 05/27/20 1050 87     Resp 05/27/20 1050 16     Temp 05/27/20 1050 97.7 F (36.5 C)     Temp Source 05/27/20 1050 Oral     SpO2 05/27/20 1050 99 %     Weight 05/27/20 1047 195 lb (88.5 kg)     Height 05/27/20 1047 6' (1.829 m)     Head Circumference --      Peak Flow --      Pain Score 05/27/20 1046 3     Pain Loc --      Pain Edu? --      Excl. in [address]? --    Updated Vital Signs BP (!) 163/87 (BP Location: Left Arm)   Pulse 87   Temp 97.7 F (36.5 C) (Oral)   Resp 16   Ht 6' (1.829 m)   Wt 88.5 kg   SpO2 99%   BMI 26.45 kg/m   Visual Acuity Right Eye Distance:   Left Eye Distance:   Bilateral Distance:    Right Eye Near:   Left Eye Near:    Bilateral Near:     Physical Exam Vitals and nursing note reviewed.  Constitutional:      General: He is not in acute distress.    Appearance: Normal appearance. He is not ill-appearing.  HENT:     Head: Normocephalic and atraumatic.  Eyes:     General:        Right eye: No discharge.        Left eye: No discharge.     Conjunctiva/sclera: Conjunctivae normal.  Pulmonary:     Effort: Pulmonary effort is normal.     Breath sounds: Normal breath sounds.  Skin:    Comments: Left distal, lateral thigh - area of erythema and mild tenderness. No fluctuance.   Neurological:     Mental Status: He  is alert.  Psychiatric:        Mood and Affect: Mood normal.        Behavior:  Behavior normal.    UC Treatments / Results  Labs (all labs ordered are listed, but only abnormal results are displayed) Labs Reviewed - No data to display  EKG   Radiology No results found.  Procedures Procedures (including critical care time)  Medications Ordered in UC Medications - No data to display  Initial Impression / Assessment and Plan / UC Course  I have reviewed the triage vital signs and the nursing notes.  Pertinent labs & imaging results that were available during my care of the patient were reviewed by me and considered in my medical decision making (see chart for details).    75 year old male presents with cellulitis. Treating with doxycycline and Bactroban ointment.   Final Clinical Impressions(s) / UC Diagnoses   Final diagnoses:  Cellulitis of left lower extremity     Discharge Instructions     Medication as prescribed.  Take care  Dr. Lacinda Axon    ED Prescriptions    Medication Sig Dispense Auth. Provider   doxycycline (VIBRAMYCIN) 100 MG capsule Take 1 capsule (100 mg total) by mouth 2 (two) times daily. 14 capsule Zulma Court G, DO   mupirocin ointment (BACTROBAN) 2 % Apply 1 application topically 3 (three) times daily for 7 days. 30 g Coral Spikes, DO     PDMP not reviewed this encounter.   Coral Spikes, [place] 05/27/20 1406

## 2020-05-27 NOTE — ED Triage Notes (Signed)
Patient states that he think he got bit by something 2 days ago.  Patient c/o redness, itching, swelling and pain at the site.  Patient denies fevers.

## 2020-07-07 ENCOUNTER — Other Ambulatory Visit: Payer: Self-pay

## 2020-07-07 ENCOUNTER — Encounter: Payer: Self-pay | Admitting: Emergency Medicine

## 2020-07-07 ENCOUNTER — Ambulatory Visit
Admission: EM | Admit: 2020-07-07 | Discharge: 2020-07-07 | Disposition: A | Discharge status: Home / Self Care | Payer: Medicare Other | Attending: Emergency Medicine | Admitting: Emergency Medicine

## 2020-07-07 DIAGNOSIS — J069 Acute upper respiratory infection, unspecified: Secondary | ICD-10-CM | POA: Diagnosis not present

## 2020-07-07 DIAGNOSIS — L03311 Cellulitis of abdominal wall: Secondary | ICD-10-CM

## 2020-07-07 MED ORDER — IPRATROPIUM BROMIDE 0.06 % NA SOLN: 2.0000 | Freq: Four times a day (QID) | NASAL | 12 refills | Status: DC

## 2020-07-07 MED ORDER — DOXYCYCLINE HYCLATE 100 MG PO CAPS: 100.0000 mg | ORAL_CAPSULE | Freq: Two times a day (BID) | ORAL | 0 refills | Status: DC

## 2020-07-07 MED ORDER — BENZONATATE 100 MG PO CAPS: 200.0000 mg | ORAL_CAPSULE | Freq: Three times a day (TID) | ORAL | 0 refills | Status: DC

## 2020-07-07 NOTE — ED Provider Notes (Signed)
[facility]    CSN: [id number] Arrival date & time: 07/07/20  1742      History   Chief Complaint Chief Complaint  Patient presents with  . Rash  . Cough  . Nasal Congestion    Chest congestion     HPI Louis Cooper is a 75 y.o. male.   HPI   75 year old male here for evaluation of multiple complaints.  Patient's been experiencing nasal congestion, chest congestion, and a nonproductive cough for the last 5 days.  This has not been associated with fever, runny nose, postnasal drip, ear pain or pressure, sore throat, shortness of breath or wheezing, or GI complaints.  Secondarily, patient has been experiencing what he calls a rash for the past 2 weeks.  It started off as a red spot on his lower right abdomen and then he developed 2 more on the side of his right thorax.  He describes it as being tender but it is not draining.  Past Medical History:  Diagnosis Date  . A-fib ([place])   . Actinic keratosis   . Arthritis   . Cataract   . CHF (congestive heart failure) ([place])   . GERD (gastroesophageal reflux disease)   . Gout   . Heart disease   . Hernia of abdominal wall   . History of heart valve replacement   . Hx of CABG   . Hyperlipidemia   . Hyperlipidemia   . Hypertension     Patient Active Problem List   Diagnosis Date Noted  . 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 CARDIOVERSION N/A 04/02/2016   Procedure: CARDIOVERSION;  Surgeon: Corey Skains, MD;  Location: [facility];  Service: Cardiovascular;  Laterality: N/A;  . CARDIOVERSION N/A 01/05/2020   Procedure: CARDIOVERSION;  Surgeon: Corey Skains, MD;  Location: [facility];  Service: Cardiovascular;  Laterality: N/A;  . CARDIOVERSION N/A 04/18/2020   Procedure: CARDIOVERSION;  Surgeon: Corey Skains, MD;  Location: [facility];  Service: Cardiovascular;  Laterality: N/A;  . HERNIA REPAIR    . TEE WITHOUT CARDIOVERSION N/A 04/02/2016   Procedure:  TRANSESOPHAGEAL ECHOCARDIOGRAM (TEE);  Surgeon: Corey Skains, MD;  Location: [facility];  Service: Cardiovascular;  Laterality: N/A;  . VALVE REPLACEMENT         Home Medications    Prior to Admission medications   Medication Sig Start Date End Date Taking? Authorizing Provider  benzonatate (TESSALON) 100 MG capsule Take 2 capsules (200 mg total) by mouth every 8 (eight) hours. 07/07/20  Yes Margarette Canada, NP  doxycycline (VIBRAMYCIN) 100 MG capsule Take 1 capsule (100 mg total) by mouth 2 (two) times daily. 07/07/20  Yes Margarette Canada, NP  ipratropium (ATROVENT) 0.06 % nasal spray Place 2 sprays into both nostrils 4 (four) times daily. 07/07/20  Yes Margarette Canada, NP  amiodarone (PACERONE) 200 MG tablet Take 200 mg by mouth 2 (two) times daily.    [provider]  apixaban (ELIQUIS) 5 MG TABS tablet Take 5 mg by mouth 2 (two) times daily.    [provider]  atorvastatin (LIPITOR) 40 MG tablet Take 40 mg by mouth daily. 09/25/15   [provider]  b complex vitamins capsule Take 2 capsules by mouth daily. Patient not taking: Reported on 04/18/2020    [provider]  Cholecalciferol (VITAMIN D3) 2000 units TABS Take 4,000 Units by mouth daily.     [provider]  metoprolol succinate (TOPROL-XL) 25  MG 24 hr tablet Take 75 mg by mouth daily.    [provider]  Multiple Vitamin (MULTIVITAMIN WITH MINERALS) TABS tablet Take 1 tablet by mouth daily.    [provider]  omeprazole (PRILOSEC) 40 MG capsule Take 40 mg by mouth daily as needed (heartburn).  03/09/18   [provider]  Sodium Chloride-Sodium Bicarb (NETI POT SINUS [place] NA) Place 1 Dose into the nose daily as needed (congestion).    [provider]  tamsulosin (FLOMAX) 0.4 MG CAPS capsule Take 0.4 mg by mouth every evening.    [provider]    Family History History reviewed. No pertinent family history.  Social History Social History    Tobacco Use  . Smoking status: Never Smoker  . Smokeless tobacco: Never Used  Vaping Use  . Vaping Use: Never used  Substance Use Topics  . Alcohol use: No     Allergies   Multaq [dronedarone]   Review of Systems Review of Systems  Constitutional: Negative for activity change, appetite change and fever.  HENT: Positive for congestion. Negative for ear pain, postnasal drip, rhinorrhea and sore throat.   Respiratory: Positive for cough. Negative for shortness of breath and wheezing.   Gastrointestinal: Negative for diarrhea, nausea and vomiting.  Skin: Positive for rash.  Hematological: Negative.   Psychiatric/Behavioral: Negative.      Physical Exam Triage Vital Signs ED Triage Vitals  Enc Vitals Group     BP 07/07/20 1806 126/72     Pulse Rate 07/07/20 1806 (!) 54     Resp 07/07/20 1806 16     Temp 07/07/20 1806 97.8 F (36.6 C)     Temp Source 07/07/20 1806 Oral     SpO2 07/07/20 1806 98 %     Weight --      Height --      Head Circumference --      Peak Flow --      Pain Score 07/07/20 1803 8     Pain Loc --      Pain Edu? --      Excl. in Day? --    No data found.  Updated Vital Signs BP 126/72   Pulse (!) 54   Temp 97.8 F (36.6 C) (Oral)   Resp 16   SpO2 98%   Visual Acuity Right Eye Distance:   Left Eye Distance:   Bilateral Distance:    Right Eye Near:   Left Eye Near:    Bilateral Near:     Physical Exam Vitals and nursing note reviewed.  Constitutional:      General: He is not in acute distress.    Appearance: Normal appearance. He is normal weight. He is not ill-appearing.  HENT:     Head: Normocephalic and atraumatic.     Right Ear: Tympanic membrane, ear canal and external ear normal. There is no impacted cerumen.     Left Ear: Tympanic membrane, ear canal and external ear normal. There is no impacted cerumen.     Nose: Congestion and rhinorrhea present.     Mouth/Throat:     Mouth: Mucous membranes are moist.     Pharynx:  Oropharynx is clear. Posterior oropharyngeal erythema present.  Cardiovascular:     Rate and Rhythm: Normal rate and regular rhythm.     Pulses: Normal pulses.     Heart sounds: Normal heart sounds. No murmur heard. No gallop.   Pulmonary:     Effort: Pulmonary effort is normal.  Breath sounds: Normal breath sounds. No wheezing, rhonchi or rales.  Musculoskeletal:     Cervical back: Normal range of motion and neck supple.  Skin:    General: Skin is warm.     Capillary Refill: Capillary refill takes less than 2 seconds.     Findings: Erythema and rash present.  Neurological:     General: No focal deficit present.     Mental Status: He is alert and oriented to person, place, and time.  Psychiatric:        Mood and Affect: Mood normal.        Behavior: Behavior normal.        Thought Content: Thought content normal.        Judgment: Judgment normal.      UC Treatments / Results  Labs (all labs ordered are listed, but only abnormal results are displayed) Labs Reviewed - No data to display  EKG   Radiology No results found.  Procedures Procedures (including critical care time)  Medications Ordered in UC Medications - No data to display  Initial Impression / Assessment and Plan / UC Course  I have reviewed the triage vital signs and the nursing notes.  Pertinent labs & imaging results that were available during my care of the patient were reviewed by me and considered in my medical decision making (see chart for details).   Is a very pleasant and nontoxic appearing 75 year old male here for evaluation of respiratory complaints and what he is terming a rash.  He states that his grandchildren have been sick and he caught that her cough.  He is also complaining of nasal congestion but denies nasal discharge, postnasal drip, or sputum production when he coughs.  He does endorse that the cough is worse when he lays down.  He denies fever, runny nose, PND, ear pain, sore  throat, shortness breath or wheezing, or GI complaints.  The rash consists of 3 small lesions on the right side of his abdomen.  The most prominent lesion is in the right abdomen just below the umbilicus that has an erythematous center that is hot and tender to touch.  There is no drainage from the area.  When viewed under magnification it looks like it could possibly be an insect bite.  The other 2 lesions are faintly erythematous raised areas on the right side of the thorax that are not hot or tender.  Patient's physical exam reveals pearly gray tympanic membranes bilaterally with normal light reflex and clear external auditory canals.  Nasal mucosa is erythematous edematous with clear nasal discharge.  Oropharyngeal exam reveals cobblestoning the posterior oropharynx with clear postnasal drip.  No cervical lymphadenopathy appreciated.  Lung sounds clear auscultation all fields.  We will treat patient for cellulitis with doxycycline twice daily for 10 days for the rash on the abdomen and right side of his thorax and then ipratropium nasal spray and Tessalon Perles for cough and nasal congestion.  Final Clinical Impressions(s) / UC Diagnoses   Final diagnoses:  Upper respiratory tract infection, unspecified type  Cellulitis of abdominal wall     Discharge Instructions     Use the Atrovent nasal spray, 2 squirts in each nostril every 6 hours, as needed for runny nose and postnasal drip.  Use the Tessalon Perles every 8 hours during the day.  Take them with a small sip of water.  They may give you some numbness to the base of your tongue or a metallic taste in your mouth,  this is normal.  Take the doxycycline twice daily with food for 10 days for treatment of the lesion on her abdomen.  Return for reevaluation or see your primary care provider for any new or worsening symptoms.     ED Prescriptions    Medication Sig Dispense Auth. Provider   doxycycline (VIBRAMYCIN) 100 MG capsule Take 1  capsule (100 mg total) by mouth 2 (two) times daily. 20 capsule Margarette Canada, NP   ipratropium (ATROVENT) 0.06 % nasal spray Place 2 sprays into both nostrils 4 (four) times daily. 15 mL Margarette Canada, NP   benzonatate (TESSALON) 100 MG capsule Take 2 capsules (200 mg total) by mouth every 8 (eight) hours. 21 capsule Margarette Canada, NP     PDMP not reviewed this encounter.   Margarette Canada, NP 07/07/20 [phone]

## 2020-07-07 NOTE — Discharge Instructions (Addendum)
Use the Atrovent nasal spray, 2 squirts in each nostril every 6 hours, as needed for runny nose and postnasal drip.  Use the Tessalon Perles every 8 hours during the day.  Take them with a small sip of water.  They may give you some numbness to the base of your tongue or a metallic taste in your mouth, this is normal.  Take the doxycycline twice daily with food for 10 days for treatment of the lesion on her abdomen.  Return for reevaluation or see your primary care provider for any new or worsening symptoms.

## 2020-07-07 NOTE — ED Triage Notes (Addendum)
Pt is present today with chest congestion, cough, and nasal congestion. Pt states that his sx started Sunday. Pt also has a rash on his his abdomen and back  located oh his stomach and back.

## 2020-08-16 ENCOUNTER — Encounter: Payer: Medicare Other | Admitting: Dermatology

## 2020-08-28 ENCOUNTER — Encounter: Payer: Medicare Other | Admitting: Dermatology

## 2020-12-07 ENCOUNTER — Ambulatory Visit (INDEPENDENT_AMBULATORY_CARE_PROVIDER_SITE_OTHER): Payer: Medicare Other | Admitting: Dermatology

## 2020-12-07 ENCOUNTER — Other Ambulatory Visit: Payer: Self-pay

## 2020-12-07 DIAGNOSIS — Z1283 Encounter for screening for malignant neoplasm of skin: Secondary | ICD-10-CM | POA: Diagnosis not present

## 2020-12-07 DIAGNOSIS — L820 Inflamed seborrheic keratosis: Secondary | ICD-10-CM | POA: Diagnosis not present

## 2020-12-07 DIAGNOSIS — D229 Melanocytic nevi, unspecified: Secondary | ICD-10-CM

## 2020-12-07 DIAGNOSIS — I8390 Asymptomatic varicose veins of unspecified lower extremity: Secondary | ICD-10-CM

## 2020-12-07 DIAGNOSIS — L570 Actinic keratosis: Secondary | ICD-10-CM

## 2020-12-07 DIAGNOSIS — L578 Other skin changes due to chronic exposure to nonionizing radiation: Secondary | ICD-10-CM | POA: Diagnosis not present

## 2020-12-07 DIAGNOSIS — L821 Other seborrheic keratosis: Secondary | ICD-10-CM

## 2020-12-07 DIAGNOSIS — D1801 Hemangioma of skin and subcutaneous tissue: Secondary | ICD-10-CM

## 2020-12-07 DIAGNOSIS — L814 Other melanin hyperpigmentation: Secondary | ICD-10-CM

## 2020-12-07 NOTE — Patient Instructions (Addendum)

## 2020-12-07 NOTE — Progress Notes (Signed)
Follow-Up Visit   Subjective  Louis Cooper is a 75 y.o. male who presents for the following: Annual Exam (Mole check ). Pt c/o irritated itchy spots on his neck. The patient presents for Total-Body Skin Exam (TBSE) for skin cancer screening and mole check.   The following portions of the chart were reviewed this encounter and updated as appropriate:   Tobacco  Allergies  Meds  Problems  Med Hx  Surg Hx  Fam Hx     Review of Systems:  No other skin or systemic complaints except as noted in HPI or Assessment and Plan.  Objective  Well appearing patient in no apparent distress; mood and affect are within normal limits.  A full examination was performed including scalp, head, eyes, ears, nose, lips, neck, chest, axillae, abdomen, back, buttocks, bilateral upper extremities, bilateral lower extremities, hands, feet, fingers, toes, fingernails, and toenails. All findings within normal limits unless otherwise noted below.  Neck x 3 (3) Erythematous keratotic or waxy stuck-on papule or plaque.   face, neck x 16, arms, hands x 6 (22) Erythematous thin papules/macules with gritty scale.    Assessment & Plan  Inflamed seborrheic keratosis Neck x 3  Destruction of lesion - Neck x 3 Complexity: simple   Destruction method: cryotherapy   Informed consent: discussed and consent obtained   Timeout:  patient name, date of birth, surgical site, and procedure verified Lesion destroyed using liquid nitrogen: Yes   Region frozen until ice ball extended beyond lesion: Yes   Outcome: patient tolerated procedure well with no complications   Post-procedure details: wound care instructions given    AK (actinic keratosis) (22) face, neck x 16, arms, hands x 6  Destruction of lesion - face, neck x 16, arms, hands x 6 Complexity: simple   Destruction method: cryotherapy   Informed consent: discussed and consent obtained   Timeout:  patient name, date of birth, surgical site, and  procedure verified Lesion destroyed using liquid nitrogen: Yes   Region frozen until ice ball extended beyond lesion: Yes   Outcome: patient tolerated procedure well with no complications   Post-procedure details: wound care instructions given    Skin cancer screening  Lentigines - Scattered tan macules - Due to sun exposure - Benign-appearing, observe - Recommend daily broad spectrum sunscreen SPF 30+ to sun-exposed areas, reapply every 2 hours as needed. - Call for any changes  Seborrheic Keratoses - Stuck-on, waxy, tan-brown papules and/or plaques  - Benign-appearing - Discussed benign etiology and prognosis. - Observe - Call for any changes  Melanocytic Nevi - Tan-brown and/or pink-flesh-colored symmetric macules and papules - Benign appearing on exam today - Observation - Call clinic for new or changing moles - Recommend daily use of broad spectrum spf 30+ sunscreen to sun-exposed areas.   Hemangiomas - Red papules - Discussed benign nature - Observe - Call for any changes  Actinic Damage - Chronic condition, secondary to cumulative UV/sun exposure - diffuse scaly erythematous macules with underlying dyspigmentation - Recommend daily broad spectrum sunscreen SPF 30+ to sun-exposed areas, reapply every 2 hours as needed.  - Staying in the shade or wearing long sleeves, sun glasses (UVA+UVB protection) and wide brim hats (4-inch brim around the entire circumference of the hat) are also recommended for sun protection.  - Call for new or changing lesions.  Varicose Veins/Spider Veins - Dilated blue, purple or red veins at the lower extremities - Reassured - Smaller vessels can be treated by sclerotherapy (a procedure to  inject a medicine into the veins to make them disappear) if desired, but the treatment is not covered by insurance. Larger vessels may be covered if symptomatic and we would refer to vascular surgeon if treatment desired.   Skin cancer screening  performed today.   Return in about 3 months (around 03/09/2021) for ISKS.  IMarye Round, CMA, am acting as scribe for Sarina Ser, MD .  Documentation: I have reviewed the above documentation for accuracy and completeness, and I agree with the above.  Sarina Ser, MD

## 2020-12-12 ENCOUNTER — Encounter: Payer: Self-pay | Admitting: Dermatology

## 2020-12-21 ENCOUNTER — Other Ambulatory Visit: Payer: Self-pay

## 2020-12-21 ENCOUNTER — Encounter: Payer: Self-pay | Admitting: Licensed Clinical Social Worker

## 2020-12-21 ENCOUNTER — Ambulatory Visit (INDEPENDENT_AMBULATORY_CARE_PROVIDER_SITE_OTHER): Payer: Medicare Other

## 2020-12-21 ENCOUNTER — Ambulatory Visit
Admission: EM | Admit: 2020-12-21 | Discharge: 2020-12-21 | Disposition: A | Discharge status: Home / Self Care | Payer: Medicare Other | Attending: Emergency Medicine | Admitting: Emergency Medicine

## 2020-12-21 DIAGNOSIS — R0602 Shortness of breath: Secondary | ICD-10-CM | POA: Diagnosis not present

## 2020-12-21 DIAGNOSIS — R051 Acute cough: Secondary | ICD-10-CM | POA: Diagnosis not present

## 2020-12-21 DIAGNOSIS — R059 Cough, unspecified: Secondary | ICD-10-CM

## 2020-12-21 MED ORDER — BENZONATATE 100 MG PO CAPS: 100.0000 mg | ORAL_CAPSULE | Freq: Three times a day (TID) | ORAL | 0 refills | Status: DC

## 2020-12-21 MED ORDER — AMOXICILLIN-POT CLAVULANATE 875-125 MG PO TABS: 1.0000 | ORAL_TABLET | Freq: Two times a day (BID) | ORAL | 0 refills | Status: DC

## 2020-12-21 NOTE — Discharge Instructions (Addendum)
Your EKG today showed atrial fibrillation at a normal pace  Your chest x-ray was negative for structural changes, infection or fluid  Therefore due to the timeline of your symptoms we will trial an antibiotic to see if that helps them to resolve  Take Augmentin twice a day for the next 7 days  May use etssalon pill every 8 hours for coughing  Follow up with cardiologist and primary doctor if symptoms persist

## 2020-12-21 NOTE — ED Provider Notes (Addendum)
[facility]    CSN: [id number] Arrival date & time: 12/21/20  1602      History   Chief Complaint Chief Complaint  Patient presents with   chest congestion     HPI Louis Cooper is a 75 y.o. male.   Patient presents with nasal congestion, nonproductive coughing with wheezing primarily at nighttime, interfering with sleep and increased shortness of breath with exertion for 2 weeks.  History of A. fib, CHF, hypertension, hyperlipidemia, heart valve replacement, GERD.  Has not attempted treatment of symptoms.  No known sick contacts.  Denies chest pain or tightness, abdominal pain, nausea, vomiting, lower extremity swelling, abdominal bloating, headaches, dizziness, lightheadedness.  Has upcoming appointment with cardiologist on 12/26/2020.   Past Medical History:  Diagnosis Date   A-fib ([employer])    Actinic keratosis    Arthritis    Cataract    CHF (congestive heart failure) (HCC)    GERD (gastroesophageal reflux disease)    Gout    Heart disease    Hernia of abdominal wall    History of heart valve replacement    Hx of CABG    Hyperlipidemia    Hyperlipidemia    Hypertension     Patient Active Problem List   Diagnosis Date Noted   Heart block 04/25/2016    Past Surgical History:  Procedure Laterality Date   CARDIOVERSION N/A 04/02/2016   Procedure: CARDIOVERSION;  Surgeon: Corey Skains, MD;  Location: [facility];  Service: Cardiovascular;  Laterality: N/A;   CARDIOVERSION N/A 01/05/2020   Procedure: CARDIOVERSION;  Surgeon: Corey Skains, MD;  Location: [facility];  Service: Cardiovascular;  Laterality: N/A;   CARDIOVERSION N/A 04/18/2020   Procedure: CARDIOVERSION;  Surgeon: Corey Skains, MD;  Location: [facility];  Service: Cardiovascular;  Laterality: N/A;   HERNIA REPAIR     TEE WITHOUT CARDIOVERSION N/A 04/02/2016   Procedure: TRANSESOPHAGEAL ECHOCARDIOGRAM (TEE);  Surgeon: Corey Skains, MD;  Location: [facility];  Service: Cardiovascular;   Laterality: N/A;   VALVE REPLACEMENT         Home Medications    Prior to Admission medications   Medication Sig Start Date End Date Taking? Authorizing Provider  amiodarone (PACERONE) 200 MG tablet Take 200 mg by mouth 2 (two) times daily.   Yes [provider]  apixaban (ELIQUIS) 5 MG TABS tablet Take 5 mg by mouth 2 (two) times daily.   Yes [provider]  atorvastatin (LIPITOR) 40 MG tablet Take 40 mg by mouth daily. 09/25/15  Yes [provider]  b complex vitamins capsule Take 2 capsules by mouth daily.   Yes [provider]  Cholecalciferol (VITAMIN D3) 2000 units TABS Take 4,000 Units by mouth daily.    Yes [provider]  ipratropium (ATROVENT) 0.06 % nasal spray Place 2 sprays into both nostrils 4 (four) times daily. 07/07/20  Yes Margarette Canada, NP  metoprolol succinate (TOPROL-XL) 25 MG 24 hr tablet Take 75 mg by mouth daily.   Yes [provider]  Multiple Vitamin (MULTIVITAMIN WITH MINERALS) TABS tablet Take 1 tablet by mouth daily.   Yes [provider]  omeprazole (PRILOSEC) 40 MG capsule Take 40 mg by mouth daily as needed (heartburn).  03/09/18  Yes [provider]  Sodium Chloride-Sodium Bicarb (NETI POT SINUS [place] NA) Place 1 Dose into the nose daily as needed (congestion).   Yes [provider]  tamsulosin (FLOMAX) 0.4 MG CAPS capsule Take 0.4 mg by mouth every evening.  Yes [provider]  benzonatate (TESSALON) 100 MG capsule Take 2 capsules (200 mg total) by mouth every 8 (eight) hours. 07/07/20   Margarette Canada, NP  doxycycline (VIBRAMYCIN) 100 MG capsule Take 1 capsule (100 mg total) by mouth 2 (two) times daily. 07/07/20   Margarette Canada, NP    Family History History reviewed. No pertinent family history.  Social History Social History   Tobacco Use   Smoking status: Never   Smokeless tobacco: Never  Vaping Use   Vaping Use: Never used  Substance Use Topics   Alcohol use:  No     Allergies   Multaq [dronedarone]   Review of Systems Review of Systems  Constitutional: Negative.   HENT:  Positive for congestion. Negative for dental problem, drooling, ear discharge, ear pain, facial swelling, hearing loss, mouth sores, nosebleeds, postnasal drip, rhinorrhea, sinus pressure, sinus pain, sneezing, sore throat, tinnitus, trouble swallowing and voice change.   Respiratory:  Positive for cough, chest tightness, shortness of breath and wheezing. Negative for apnea, choking and stridor.   Cardiovascular: Negative.   Gastrointestinal: Negative.   Skin: Negative.   Neurological: Negative.     Physical Exam Triage Vital Signs ED Triage Vitals  Enc Vitals Group     BP 12/21/20 1650 (!) 161/100     Pulse Rate 12/21/20 1650 74     Resp 12/21/20 1650 16     Temp 12/21/20 1650 97.7 F (36.5 C)     Temp Source 12/21/20 1650 Oral     SpO2 12/21/20 1650 92 %     Weight 12/21/20 1647 201 lb (91.2 kg)     Height 12/21/20 1647 6' (1.829 m)     Head Circumference --      Peak Flow --      Pain Score --      Pain Loc --      Pain Edu? --      Excl. in [place]? --    No data found.  Updated Vital Signs BP (!) 161/100 (BP Location: Right Arm)   Pulse 74   Temp 97.7 F (36.5 C) (Oral)   Resp 16   Ht 6' (1.829 m)   Wt 201 lb (91.2 kg)   SpO2 92%   BMI 27.26 kg/m   Visual Acuity Right Eye Distance:   Left Eye Distance:   Bilateral Distance:    Right Eye Near:   Left Eye Near:    Bilateral Near:     Physical Exam Constitutional:      Appearance: Normal appearance. He is normal weight.  HENT:     Head: Normocephalic.     Right Ear: Tympanic membrane, ear canal and external ear normal.     Left Ear: Tympanic membrane, ear canal and external ear normal.     Nose: Congestion present. No rhinorrhea.     Mouth/Throat:     Mouth: Mucous membranes are moist.     Pharynx: Oropharynx is clear.  Eyes:     Extraocular Movements: Extraocular movements intact.   Cardiovascular:     Rate and Rhythm: Normal rate and regular rhythm.     Pulses: Normal pulses.     Heart sounds: Normal heart sounds.  Pulmonary:     Effort: Pulmonary effort is normal.     Breath sounds: Normal breath sounds.  Skin:    General: Skin is warm and dry.  Neurological:     Mental Status: He is alert and oriented to person, place, and time.  Mental status is at baseline.  Psychiatric:        Mood and Affect: Mood normal.        Behavior: Behavior normal.     UC Treatments / Results  Labs (all labs ordered are listed, but only abnormal results are displayed) Labs Reviewed - No data to display  EKG   Radiology No results found.  Procedures Procedures (including critical care time)  Medications Ordered in UC Medications - No data to display  Initial Impression / Assessment and Plan / UC Course  I have reviewed the triage vital signs and the nursing notes.  Pertinent labs & imaging results that were available during my care of the patient were reviewed by me and considered in my medical decision making (see chart for details).  Acute cough Shortness of breath  1.  Chest x-ray negative 2.  EKG showing atrial fibrillation, paced 74, no change from patient's baseline 3.  Augmentin 875-125 mg twice daily for 7 days, will prophylactically cover for any infection due to timeline of symptoms 4.  Patient has upcoming appointment with cardiologist, advised to notify physician of current symptoms, patient needs to follow-up with primary care doctor as well if symptoms persist past cardiologist visit Final Clinical Impressions(s) / UC Diagnoses   Final diagnoses:  None   Discharge Instructions   None    ED Prescriptions   None    PDMP not reviewed this encounter.   Hans Eden, NP 12/21/20 1840    Hans Eden, NP 12/21/20 1840

## 2020-12-21 NOTE — ED Triage Notes (Signed)
Pt c/o coughing at night time, sob at night when trying to lay down. Pt states that he has A fib.

## 2020-12-22 ENCOUNTER — Ambulatory Visit: Payer: Medicare Other

## 2021-03-12 ENCOUNTER — Ambulatory Visit: Payer: Medicare Other | Admitting: Dermatology

## 2021-04-08 ENCOUNTER — Encounter: Payer: Self-pay | Admitting: Emergency Medicine

## 2021-04-08 ENCOUNTER — Ambulatory Visit
Admission: EM | Admit: 2021-04-08 | Discharge: 2021-04-08 | Discharge status: Against Medical Advice | Payer: Medicare Other | Attending: Nurse Practitioner | Admitting: Nurse Practitioner

## 2021-04-08 ENCOUNTER — Other Ambulatory Visit: Payer: Self-pay

## 2021-04-08 DIAGNOSIS — R0902 Hypoxemia: Secondary | ICD-10-CM

## 2021-04-08 DIAGNOSIS — R051 Acute cough: Secondary | ICD-10-CM

## 2021-04-08 DIAGNOSIS — Z5329 Procedure and treatment not carried out because of patient's decision for other reasons: Secondary | ICD-10-CM

## 2021-04-08 DIAGNOSIS — R0600 Dyspnea, unspecified: Secondary | ICD-10-CM

## 2021-04-08 DIAGNOSIS — I4891 Unspecified atrial fibrillation: Secondary | ICD-10-CM | POA: Diagnosis not present

## 2021-04-08 NOTE — ED Provider Notes (Signed)
[facility]    CSN: [id number] Arrival date & time: 04/08/21  1204      History   Chief Complaint Chief Complaint  Patient presents with   Cough   Shortness of Breath    HPI Louis Cooper is a 76 y.o. male.   Subjective:   Louis Cooper is a 76 y.o. male with a past medical history of paroxysmal atrial fibrillation, hypertension, severe aortic valve stenosis s/p replacement x 2,  CAD s/p one vessel CABG in 2016, diabetes and hyperlipidemia that presents with shortness of breath, cough and fatigue. He has had a cough for the past week. The dyspnea has been gradual but worse today. The cough is non-productive and is aggravated by nothing in particular.  He denies fevers, chills, body aches, runny nose, nasal congestion, wheezing, lower extremity swelling, chest pain, palpitations or headache. Patient does not have a history of asthma or COPD.  Patient has not had recent travel. Patient does not have a history of smoking. He denies any known sick contacts.  Notably, the patient is currently followed by cardiology within the [facility] for shortness of breath, CAD and persistent atrial fibrillation.  He was last seen in January after Holter monitoring.  He was noted to have wide variability in his heart rate from the low 40s to high 70s.  He is scheduled for a stress test on tomorrow.  He is on Toprol and Eliquis chronically.    The following portions of the patient's history were reviewed and updated as appropriate: allergies, current medications, past family history, past medical history, past social history, past surgical history, and problem list.       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    Past Surgical History:  Procedure Laterality Date   CARDIOVERSION N/A 04/02/2016   Procedure: CARDIOVERSION;  Surgeon: Corey Skains, MD;  Location: [facility];  Service: Cardiovascular;  Laterality: N/A;   CARDIOVERSION N/A 01/05/2020   Procedure: CARDIOVERSION;  Surgeon: Corey Skains, MD;  Location: [facility];  Service: Cardiovascular;  Laterality: N/A;   CARDIOVERSION N/A 04/18/2020   Procedure: CARDIOVERSION;  Surgeon: Corey Skains, MD;  Location: [facility];  Service: Cardiovascular;  Laterality: N/A;   HERNIA REPAIR     TEE WITHOUT CARDIOVERSION N/A 04/02/2016   Procedure: TRANSESOPHAGEAL ECHOCARDIOGRAM (TEE);  Surgeon: Corey Skains, MD;  Location: [facility];  Service: Cardiovascular;  Laterality: N/A;   VALVE REPLACEMENT         Home Medications    Prior to Admission medications   Medication Sig Start Date End Date Taking? Authorizing Provider  amiodarone (PACERONE) 200 MG tablet Take 200 mg by mouth 2 (two) times daily.   Yes [provider]  apixaban (ELIQUIS) 5 MG TABS tablet Take 5 mg by mouth 2 (two) times daily.   Yes [provider]  atorvastatin (LIPITOR) 40 MG tablet Take 40 mg by mouth daily. 09/25/15  Yes [provider]  b complex vitamins capsule Take 2 capsules by mouth daily.   Yes [provider]  Cholecalciferol (VITAMIN D3) 2000 units TABS Take 4,000 Units by mouth daily.    Yes [provider]  metoprolol succinate (TOPROL-XL) 25 MG 24 hr tablet Take 75 mg by mouth daily.   Yes [provider]  Multiple Vitamin (MULTIVITAMIN WITH MINERALS) TABS tablet Take 1 tablet by mouth daily.   Yes [provider]  omeprazole (PRILOSEC) 40 MG capsule Take 40 mg by mouth daily as needed (heartburn).  03/09/18  Yes [provider]  tamsulosin (FLOMAX) 0.4 MG  CAPS capsule Take 0.4 mg by mouth every evening.   Yes [provider]  Sodium Chloride-Sodium Bicarb (NETI POT SINUS [place] NA) Place 1 Dose into the nose daily as needed (congestion).    [provider]    Family History History reviewed. No pertinent family history.  Social History Social History   Tobacco Use   Smoking status: Never   Smokeless tobacco: Never  Vaping Use   Vaping Use: Never used  Substance Use Topics   Alcohol use: No     Allergies   Multaq [dronedarone]   Review of Systems Review of Systems  Constitutional:  Negative for fever.  HENT:  Negative for congestion and rhinorrhea.   Respiratory:  Positive for cough and shortness of breath.   Cardiovascular:  Negative for chest pain, palpitations and leg swelling.  Gastrointestinal:  Negative for nausea and vomiting.  Neurological:  Negative for dizziness and headaches.  All other systems reviewed and are negative.   Physical Exam Triage Vital Signs ED Triage Vitals  Enc Vitals Group     BP 04/08/21 1231 137/87     Pulse Rate 04/08/21 1231 (!) 128     Resp 04/08/21 1231 18     Temp 04/08/21 1231 98.2 F (36.8 C)     Temp Source 04/08/21 1231 Oral     SpO2 04/08/21 1231 90 %     Weight 04/08/21 1233 210 lb (95.3 kg)     Height 04/08/21 1233 6' (1.829 m)     Head Circumference --      Peak Flow --      Pain Score 04/08/21 1232 0     Pain Loc --      Pain Edu? --      Excl. in [place]? --    No data found.  Updated Vital Signs BP 137/87 (BP Location: Left Arm)    Pulse (!) 128    Temp 98.2 F (36.8 C) (Oral)    Resp 18    Ht 6' (1.829 m)    Wt 210 lb (95.3 kg)    SpO2 90%    BMI 28.48 kg/m   Visual Acuity Right Eye Distance:   Left Eye Distance:   Bilateral Distance:    Right Eye Near:   Left Eye Near:    Bilateral Near:     Physical Exam Vitals reviewed.  Constitutional:      General: He is not in acute distress.    Appearance: He is well-developed. He is not  ill-appearing, toxic-appearing or diaphoretic.  HENT:     Head: Normocephalic.  Cardiovascular:     Rate and Rhythm: Rhythm irregular.     Comments: High variability in heart rate ranging from 29-128 bpm  Pulmonary:     Effort: Tachypnea present. No respiratory distress.  Abdominal:     Palpations: Abdomen is soft.  Musculoskeletal:  General: Normal range of motion.     Cervical back: Normal range of motion and neck supple.  Skin:    General: Skin is warm and dry.  Neurological:     General: No focal deficit present.     Mental Status: He is alert and oriented to person, place, and time.  Psychiatric:        Behavior: Behavior normal.     UC Treatments / Results  Labs (all labs ordered are listed, but only abnormal results are displayed) Labs Reviewed - No data to display  EKG   Radiology No results found.  Procedures ED EKG  Date/Time: 04/08/2021 12:52 PM Performed by: Enrique Sack, FNP Authorized by: Enrique Sack, FNP   ECG reviewed by ED Physician in the absence of a cardiologist: yes   Previous ECG:    Previous ECG:  Unavailable Interpretation:    Interpretation: abnormal   Rate:    ECG rate:  120   ECG rate assessment: tachycardic   Rhythm:    Rhythm: atrial fibrillation   Ectopy:    Ectopy: PVCs   QRS:    QRS conduction: LBBB   (including critical care time)  Medications Ordered in UC Medications - No data to display  Initial Impression / Assessment and Plan / UC Course  I have reviewed the triage vital signs and the nursing notes.  Pertinent labs & imaging results that were available during my care of the patient were reviewed by me and considered in my medical decision making (see chart for details).    76 year old male with significant cardiac history including atrial fibrillation, hypertension, severe aortic valve stenosis, and CAD that presents with cough and shortness of breath.  Symptoms started about a week ago but has  worsened with severe dyspnea starting today.  No fevers, chills, body aches, congestion, wheezing, lower extremity swelling, chest pain, palpitations or headache.  In the clinic, patient is afebrile and nontoxic.  There is an increased work of breathing but no acute respiratory distress.  He is hypoxic with an oxygen saturations between 89% to 91%.  Heart rate initially 128.  EKG shows atrial fibrillation with rapid ventricular response.  His heart rate has ranged from 29-128 while in the clinic.  He is normotensive.  Due to his age, history, presenting symptoms, hypoxia and variable heart rate, patient was advised ED evaluation preferably within the [facility] healthcare system via EMS.  Patient declines EMS transport but agreeable to ED evaluation.  Because he does not want to be transferred by EMS, the patient was advised that he will be leaving here [place].  Extensively discussed the consequences of this decision.  Patient is alert and oriented x3.  He is here in the clinic with his daughter.  He acknowledges and accepts responsibility of his decision. Any liability to me as a provider and the [facility] healthcare system has been released by the patient. Copy of EKG sent with patient/daughter. Advised that if there is any change in his condition, that she pull over immediately and call 911. Patient and daughter agreeable.  This care was provided during an unprecedented National Emergency due to the Novel Coronavirus (COVID-19) pandemic. COVID-19 infections and transmission risks place heavy strains on healthcare resources.  As this pandemic evolves, our facility, providers, and staff strive to respond fluidly, to remain operational, and to provide care relative to available resources and information. Outcomes are unpredictable and treatments are without well-defined guidelines. Further, the impact of COVID-19 on all  aspects of urgent care, including the impact to patients seeking care for reasons  other than COVID-19, is unavoidable during this national emergency. At this time of the global pandemic, management of patients has significantly changed, even for non-COVID positive patients given high local and regional COVID volumes at this time requiring high healthcare system and resource utilization. The standard of care for management of both COVID suspected and non-COVID suspected patients continues to change rapidly at the local, regional, national, and global levels. This patient was worked up and treated to the best available but ever changing evidence and resources available at this current time.   Documentation was completed with the aid of voice recognition software. Transcription may contain typographical errors. Final Clinical Impressions(s) / UC Diagnoses   Final diagnoses:  Acute cough  Dyspnea, unspecified type  Atrial fibrillation with rapid ventricular response (HCC)  Hypoxia  Left against medical advice     Discharge Instructions      You have been seen today for cough and shortness of breath.  You have a history of atrial fibrillation as well as several other cardiac issues.  You have been found to be in atrial fibrillation today with a heart rate ranging from 29-128.  Your oxygen is on the low end of normal ranging from 89% to 91%.  I recommend that you be seen emergently at [facility] for further evaluation as your cardiologist and other healthcare providers are within the [facility] system. Based on your symptoms, vital signs, age and history I highly recommend that you go to the ER by ambulance. However, you have decided that you do not want to be transported by ambulance and will have your daughter transport you by vehicle. Therefore, you have been discharged [place] and understand that me as your healthcare provider and the [facility] is released from any liability from your decision. Please go straight to the ED from here.  If  your condition should change in route, pullover immediately and called 911.       ED Prescriptions   None    PDMP not reviewed this encounter.   Enrique Sack, [place] 04/08/21 1325

## 2021-04-08 NOTE — ED Triage Notes (Signed)
Patient states that about 4 weeks ago he had an abnormal chest xray and was started on an antibiotic.  Patient states that for the past couple of days he has had more congestion and phlyem.  Patient reports SOB this morning.  Patient unsure of fevers.

## 2021-04-08 NOTE — Discharge Instructions (Addendum)
You have been seen today for cough and shortness of breath.  You have a history of atrial fibrillation as well as several other cardiac issues.  You have been found to be in atrial fibrillation today with a heart rate ranging from 29-128.  Your oxygen is on the low end of normal ranging from 89% to 91%.  I recommend that you be seen emergently at [facility] for further evaluation as your cardiologist and other healthcare providers are within the [facility] system. Based on your symptoms, vital signs, age and history I highly recommend that you go to the ER by ambulance. However, you have decided that you do not want to be transported by ambulance and will have your daughter transport you by vehicle. Therefore, you have been discharged [place] and understand that me as your healthcare provider and the [facility] is released from any liability from your decision. Please go straight to the ED from here.  If your condition should change in route, pullover immediately and called 911.

## 2022-01-30 ENCOUNTER — Ambulatory Visit: Payer: Medicare Other | Admitting: Dermatology

## 2022-02-21 ENCOUNTER — Ambulatory Visit (INDEPENDENT_AMBULATORY_CARE_PROVIDER_SITE_OTHER): Payer: Medicare Other | Admitting: Dermatology

## 2022-02-21 DIAGNOSIS — L578 Other skin changes due to chronic exposure to nonionizing radiation: Secondary | ICD-10-CM | POA: Diagnosis not present

## 2022-02-21 DIAGNOSIS — L820 Inflamed seborrheic keratosis: Secondary | ICD-10-CM | POA: Diagnosis not present

## 2022-02-21 DIAGNOSIS — L821 Other seborrheic keratosis: Secondary | ICD-10-CM | POA: Diagnosis not present

## 2022-02-21 NOTE — Progress Notes (Signed)
   Follow-Up Visit   Subjective  Louis Cooper is a 77 y.o. male who presents for the following: Irregular skin lesion (On the R upper back - crusted, tender, has been there for a few months. He had another similar lesion on the back that has since healed). The patient has spots, moles and lesions to be evaluated, some may be new or changing and the patient has concerns that these could be cancer.  The following portions of the chart were reviewed this encounter and updated as appropriate:   Tobacco  Allergies  Meds  Problems  Med Hx  Surg Hx  Fam Hx     Review of Systems:  No other skin or systemic complaints except as noted in HPI or Assessment and Plan.  Objective  Well appearing patient in no apparent distress; mood and affect are within normal limits.  A focused examination was performed including the face, trunk, and extremities. Relevant physical exam findings are noted in the Assessment and Plan.  R upper back x 1 Erythematous stuck-on, waxy papule or plaque   Assessment & Plan  Inflamed seborrheic keratosis R upper back x 1 Symptomatic, irritating, patient would like treated. If not resolved at follow up consider biopsy. Destruction of lesion - R upper back x 1 Complexity: simple   Destruction method: cryotherapy   Informed consent: discussed and consent obtained   Timeout:  patient name, date of birth, surgical site, and procedure verified Lesion destroyed using liquid nitrogen: Yes   Region frozen until ice ball extended beyond lesion: Yes   Outcome: patient tolerated procedure well with no complications   Post-procedure details: wound care instructions given    Actinic Damage - chronic, secondary to cumulative UV radiation exposure/sun exposure over time - diffuse scaly erythematous macules with underlying dyspigmentation - Recommend daily broad spectrum sunscreen SPF 30+ to sun-exposed areas, reapply every 2 hours as needed.  - Recommend staying in the  shade or wearing long sleeves, sun glasses (UVA+UVB protection) and wide brim hats (4-inch brim around the entire circumference of the hat). - Call for new or changing lesions.  Seborrheic Keratoses - Stuck-on, waxy, tan-brown papules and/or plaques  - Benign-appearing - Discussed benign etiology and prognosis. - Observe - Call for any changes  Return for appointment as scheduled.  Luther Redo, CMA, am acting as scribe for Sarina Ser, MD . Documentation: I have reviewed the above documentation for accuracy and completeness, and I agree with the above.  Sarina Ser, MD

## 2022-02-21 NOTE — Patient Instructions (Signed)
Due to recent changes in healthcare laws, you may see results of your pathology and/or laboratory studies on MyChart before the doctors have had a chance to review them. We understand that in some cases there may be results that are confusing or concerning to you. Please understand that not all results are received at the same time and often the doctors may need to interpret multiple results in order to provide you with the best plan of care or course of treatment. Therefore, we ask that you please give us 2 business days to thoroughly review all your results before contacting the office for clarification. Should we see a critical lab result, you will be contacted sooner.   If You Need Anything After Your Visit  If you have any questions or concerns for your doctor, please call our main line at [phone] and press option 4 to reach your doctor's medical assistant. If no one answers, please leave a voicemail as directed and we will return your call as soon as possible. Messages left after 4 pm will be answered the following business day.   You may also send us a message via MyChart. We typically respond to MyChart messages within 1-2 business days.  For prescription refills, please ask your pharmacy to contact our office. Our fax number is [phone].  If you have an urgent issue when the clinic is closed that cannot wait until the next business day, you can page your doctor at the number below.    Please note that while we do our best to be available for urgent issues outside of office hours, we are not available 24/7.   If you have an urgent issue and are unable to reach us, you may choose to seek medical care at your doctor's office, retail clinic, urgent care center, or emergency room.  If you have a medical emergency, please immediately call 911 or go to the emergency department.  Pager Numbers  - Dr. Kowalski: [phone]  - Dr. Moye: [phone]  - Dr. Stewart:  [phone]  In the event of inclement weather, please call our main line at [phone] for an update on the status of any delays or closures.  Dermatology Medication Tips: Please keep the boxes that topical medications come in in order to help keep track of the instructions about where and how to use these. Pharmacies typically print the medication instructions only on the boxes and not directly on the medication tubes.   If your medication is too expensive, please contact our office at [phone] option 4 or send us a message through MyChart.   We are unable to tell what your co-pay for medications will be in advance as this is different depending on your insurance coverage. However, we may be able to find a substitute medication at lower cost or fill out paperwork to get insurance to cover a needed medication.   If a prior authorization is required to get your medication covered by your insurance company, please allow us 1-2 business days to complete this process.  Drug prices often vary depending on where the prescription is filled and some pharmacies may offer cheaper prices.  The website www.goodrx.com contains coupons for medications through different pharmacies. The prices here do not account for what the cost may be with help from insurance (it may be cheaper with your insurance), but the website can give you the price if you did not use any insurance.  - You can print the associated coupon and take it with   your prescription to the pharmacy.  - You may also stop by our office during regular business hours and pick up a GoodRx coupon card.  - If you need your prescription sent electronically to a different pharmacy, notify our office through [facility] MyChart or by phone at [phone] option 4.     Si Usted Necesita Algo Despus de Su Visita  Tambin puede enviarnos un mensaje a travs de MyChart. Por lo general respondemos a los mensajes de MyChart en el transcurso de 1 a 2  das hbiles.  Para renovar recetas, por favor pida a su farmacia que se ponga en contacto con nuestra oficina. Nuestro nmero de fax es el [phone].  Si tiene un asunto urgente cuando la clnica est cerrada y que no puede esperar hasta el siguiente da hbil, puede llamar/localizar a su doctor(a) al nmero que aparece a continuacin.   Por favor, tenga en cuenta que aunque hacemos todo lo posible para estar disponibles para asuntos urgentes fuera del horario de oficina, no estamos disponibles las 24 horas del da, los 7 das de la semana.   Si tiene un problema urgente y no puede comunicarse con nosotros, puede optar por buscar atencin mdica  en el consultorio de su doctor(a), en una clnica privada, en un centro de atencin urgente o en una sala de emergencias.  Si tiene una emergencia mdica, por favor llame inmediatamente al 911 o vaya a la sala de emergencias.  Nmeros de bper  - Dr. Kowalski: [phone]  - Dra. Moye: [phone]  - Dra. Stewart: [phone]  En caso de inclemencias del tiempo, por favor llame a nuestra lnea principal al [phone] para una actualizacin sobre el estado de cualquier retraso o cierre.  Consejos para la medicacin en dermatologa: Por favor, guarde las cajas en las que vienen los medicamentos de uso tpico para ayudarle a seguir las instrucciones sobre dnde y cmo usarlos. Las farmacias generalmente imprimen las instrucciones del medicamento slo en las cajas y no directamente en los tubos del medicamento.   Si su medicamento es muy caro, por favor, pngase en contacto con nuestra oficina llamando al [phone] y presione la opcin 4 o envenos un mensaje a travs de MyChart.   No podemos decirle cul ser su copago por los medicamentos por adelantado ya que esto es diferente dependiendo de la cobertura de su seguro. Sin embargo, es posible que podamos encontrar un medicamento sustituto a menor costo o llenar un formulario para que el  seguro cubra el medicamento que se considera necesario.   Si se requiere una autorizacin previa para que su compaa de seguros cubra su medicamento, por favor permtanos de 1 a 2 das hbiles para completar este proceso.  Los precios de los medicamentos varan con frecuencia dependiendo del lugar de dnde se surte la receta y alguna farmacias pueden ofrecer precios ms baratos.  El sitio web www.goodrx.com tiene cupones para medicamentos de diferentes farmacias. Los precios aqu no tienen en cuenta lo que podra costar con la ayuda del seguro (puede ser ms barato con su seguro), pero el sitio web puede darle el precio si no utiliz ningn seguro.  - Puede imprimir el cupn correspondiente y llevarlo con su receta a la farmacia.  - Tambin puede pasar por nuestra oficina durante el horario de atencin regular y recoger una tarjeta de cupones de GoodRx.  - Si necesita que su receta se enve electrnicamente a una farmacia diferente, informe a nuestra oficina a travs de MyChart de [place]   o por telfono llamando al [phone] y presione la opcin 4.  

## 2022-02-23 ENCOUNTER — Encounter: Payer: Self-pay | Admitting: Dermatology

## 2022-03-06 ENCOUNTER — Institutional Professional Consult (permissible substitution): Payer: Medicare Other | Admitting: Cardiology

## 2022-03-27 ENCOUNTER — Institutional Professional Consult (permissible substitution): Payer: Medicare Other | Admitting: Cardiology

## 2022-04-08 ENCOUNTER — Encounter: Payer: Self-pay | Admitting: Cardiology

## 2022-04-10 ENCOUNTER — Ambulatory Visit: Payer: Medicare Other | Admitting: Dermatology

## 2022-06-17 ENCOUNTER — Ambulatory Visit (INDEPENDENT_AMBULATORY_CARE_PROVIDER_SITE_OTHER): Payer: Medicare Other | Admitting: Dermatology

## 2022-06-17 ENCOUNTER — Telehealth: Payer: Self-pay

## 2022-06-17 ENCOUNTER — Encounter: Payer: Self-pay | Admitting: Dermatology

## 2022-06-17 VITALS — BP 120/78 | HR 91

## 2022-06-17 DIAGNOSIS — L578 Other skin changes due to chronic exposure to nonionizing radiation: Secondary | ICD-10-CM | POA: Diagnosis not present

## 2022-06-17 DIAGNOSIS — L820 Inflamed seborrheic keratosis: Secondary | ICD-10-CM | POA: Diagnosis not present

## 2022-06-17 DIAGNOSIS — L821 Other seborrheic keratosis: Secondary | ICD-10-CM | POA: Diagnosis not present

## 2022-06-17 DIAGNOSIS — C44529 Squamous cell carcinoma of skin of other part of trunk: Secondary | ICD-10-CM

## 2022-06-17 DIAGNOSIS — C4492 Squamous cell carcinoma of skin, unspecified: Secondary | ICD-10-CM

## 2022-06-17 DIAGNOSIS — D492 Neoplasm of unspecified behavior of bone, soft tissue, and skin: Secondary | ICD-10-CM

## 2022-06-17 HISTORY — DX: Squamous cell carcinoma of skin, unspecified: C44.92

## 2022-06-17 NOTE — Progress Notes (Unsigned)
Follow-Up Visit   Subjective  Louis Cooper is a 77 y.o. male who presents for the following: Spot at right upper back. Hx of ISK Tx with LN2 02/21/2022 The patient has spots, moles and lesions to be evaluated, some may be new or changing and the patient has concerns that these could be cancer.  The following portions of the chart were reviewed this encounter and updated as appropriate: medications, allergies, medical history  Review of Systems:  No other skin or systemic complaints except as noted in HPI or Assessment and Plan.  Objective  Well appearing patient in no apparent distress; mood and affect are within normal limits.  A focused examination was performed of the following areas: back Relevant physical exam findings are noted in the Assessment and Plan.  Right Upper Back Paraspinal 3.2 cm erythematous hyperkeratotic plaque        Assessment & Plan   Neoplasm of skin Right Upper Back Paraspinal  Epidermal / dermal shaving  Lesion diameter (cm):  3.2 Informed consent: discussed and consent obtained   Timeout: patient name, date of birth, surgical site, and procedure verified   Procedure prep:  Patient was prepped and draped in usual sterile fashion Prep type:  Isopropyl alcohol Anesthesia: the lesion was anesthetized in a standard fashion   Anesthetic:  1% lidocaine w/ epinephrine 1-100,000 buffered w/ 8.4% NaHCO3 Instrument used: flexible razor blade   Hemostasis achieved with: pressure, aluminum chloride and electrodesiccation   Outcome: patient tolerated procedure well   Post-procedure details: sterile dressing applied and wound care instructions given   Dressing type: bandage and petrolatum    Destruction of lesion Complexity: extensive   Destruction method: electrodesiccation and curettage   Informed consent: discussed and consent obtained   Timeout:  patient name, date of birth, surgical site, and procedure verified Procedure prep:  Patient was  prepped and draped in usual sterile fashion Prep type:  Isopropyl alcohol Anesthesia: the lesion was anesthetized in a standard fashion   Anesthetic:  1% lidocaine w/ epinephrine 1-100,000 buffered w/ 8.4% NaHCO3 Curettage performed in three different directions: Yes   Electrodesiccation performed over the curetted area: Yes   Curettage cycles:  3 Lesion length (cm):  3.2 Lesion width (cm):  3.2 Margin per side (cm):  0.2 Final wound size (cm):  3.6 Hemostasis achieved with:  pressure and aluminum chloride Outcome: patient tolerated procedure well with no complications   Post-procedure details: sterile dressing applied and wound care instructions given   Dressing type: bandage and petrolatum    Specimen 1 - Surgical pathology Differential Diagnosis: ISK vs SCC  Check Margins: No  Seborrheic keratosis  Actinic skin damage  Inflamed seborrheic keratosis   INFLAMED SEBORRHEIC KERATOSIS Exam: Erythematous keratotic or waxy stuck-on papule or plaque.  Symptomatic, irritating, patient would like treated.  Benign-appearing.  Call clinic for new or changing lesions.   Prior to procedure, discussed risks of blister formation, small wound, skin dyspigmentation, or rare scar following treatment. Recommend Vaseline ointment to treated areas while healing.  Destruction Procedure Note Destruction method: cryotherapy   Informed consent: discussed and consent obtained   Lesion destroyed using liquid nitrogen: Yes   Outcome: patient tolerated procedure well with no complications   Post-procedure details: wound care instructions given   Locations: back and left arm # of Lesions Treated: 17  ACTINIC DAMAGE - chronic, secondary to cumulative UV radiation exposure/sun exposure over time - diffuse scaly erythematous macules with underlying dyspigmentation - Recommend daily broad spectrum sunscreen SPF  30+ to sun-exposed areas, reapply every 2 hours as needed.  - Recommend staying in the  shade or wearing long sleeves, sun glasses (UVA+UVB protection) and wide brim hats (4-inch brim around the entire circumference of the hat). - Call for new or changing lesions.  SEBORRHEIC KERATOSIS - Stuck-on, waxy, tan-brown papules and/or plaques  - Benign-appearing - Discussed benign etiology and prognosis. - Observe - Call for any changes  Return for TBSE As Scheduled.  I, Lawson Radar, CMA, am acting as scribe for Armida Sans, MD.  Documentation: I have reviewed the above documentation for accuracy and completeness, and I agree with the above.  Armida Sans, MD

## 2022-06-17 NOTE — Patient Instructions (Addendum)
Cryotherapy Aftercare  Wash gently with soap and water everyday.   Apply Vaseline and Band-Aid daily until healed.    Wound Care Instructions  Cleanse wound gently with soap and water once a day then pat dry with clean gauze. Apply a thin coat of Petrolatum (petroleum jelly, "Vaseline") over the wound (unless you have an allergy to this). We recommend that you use a new, sterile tube of Vaseline. Do not pick or remove scabs. Do not remove the yellow or white "healing tissue" from the base of the wound.  Cover the wound with fresh, clean, nonstick gauze and secure with paper tape. You may use Band-Aids in place of gauze and tape if the wound is small enough, but would recommend trimming much of the tape off as there is often too much. Sometimes Band-Aids can irritate the skin.  You should call the office for your biopsy report after 1 week if you have not already been contacted.  If you experience any problems, such as abnormal amounts of bleeding, swelling, significant bruising, significant pain, or evidence of infection, please call the office immediately.  FOR ADULT SURGERY PATIENTS: If you need something for pain relief you may take 1 extra strength Tylenol (acetaminophen) AND 2 Ibuprofen (200mg each) together every 4 hours as needed for pain. (do not take these if you are allergic to them or if you have a reason you should not take them.) Typically, you may only need pain medication for 1 to 3 days.     Due to recent changes in healthcare laws, you may see results of your pathology and/or laboratory studies on MyChart before the doctors have had a chance to review them. We understand that in some cases there may be results that are confusing or concerning to you. Please understand that not all results are received at the same time and often the doctors may need to interpret multiple results in order to provide you with the best plan of care or course of treatment. Therefore, we ask that you  please give us 2 business days to thoroughly review all your results before contacting the office for clarification. Should we see a critical lab result, you will be contacted sooner.   If You Need Anything After Your Visit  If you have any questions or concerns for your doctor, please call our main line at [phone] and press option 4 to reach your doctor's medical assistant. If no one answers, please leave a voicemail as directed and we will return your call as soon as possible. Messages left after 4 pm will be answered the following business day.   You may also send us a message via MyChart. We typically respond to MyChart messages within 1-2 business days.  For prescription refills, please ask your pharmacy to contact our office. Our fax number is [phone].  If you have an urgent issue when the clinic is closed that cannot wait until the next business day, you can page your doctor at the number below.    Please note that while we do our best to be available for urgent issues outside of office hours, we are not available 24/7.   If you have an urgent issue and are unable to reach us, you may choose to seek medical care at your doctor's office, retail clinic, urgent care center, or emergency room.  If you have a medical emergency, please immediately call 911 or go to the emergency department.  Pager Numbers  - Dr. Kowalski: [phone]  -   Dr. Moye: [phone]  - Dr. Stewart: [phone]  In the event of inclement weather, please call our main line at [phone] for an update on the status of any delays or closures.  Dermatology Medication Tips: Please keep the boxes that topical medications come in in order to help keep track of the instructions about where and how to use these. Pharmacies typically print the medication instructions only on the boxes and not directly on the medication tubes.   If your medication is too expensive, please contact our office at  [phone] option 4 or send us a message through MyChart.   We are unable to tell what your co-pay for medications will be in advance as this is different depending on your insurance coverage. However, we may be able to find a substitute medication at lower cost or fill out paperwork to get insurance to cover a needed medication.   If a prior authorization is required to get your medication covered by your insurance company, please allow us 1-2 business days to complete this process.  Drug prices often vary depending on where the prescription is filled and some pharmacies may offer cheaper prices.  The website www.goodrx.com contains coupons for medications through different pharmacies. The prices here do not account for what the cost may be with help from insurance (it may be cheaper with your insurance), but the website can give you the price if you did not use any insurance.  - You can print the associated coupon and take it with your prescription to the pharmacy.  - You may also stop by our office during regular business hours and pick up a GoodRx coupon card.  - If you need your prescription sent electronically to a different pharmacy, notify our office through  MyChart or by phone at [phone] option 4.     Si Usted Necesita Algo Despus de Su Visita  Tambin puede enviarnos un mensaje a travs de MyChart. Por lo general respondemos a los mensajes de MyChart en el transcurso de 1 a 2 das hbiles.  Para renovar recetas, por favor pida a su farmacia que se ponga en contacto con nuestra oficina. Nuestro nmero de fax es el [phone].  Si tiene un asunto urgente cuando la clnica est cerrada y que no puede esperar hasta el siguiente da hbil, puede llamar/localizar a su doctor(a) al nmero que aparece a continuacin.   Por favor, tenga en cuenta que aunque hacemos todo lo posible para estar disponibles para asuntos urgentes fuera del horario de oficina, no estamos  disponibles las 24 horas del da, los 7 das de la semana.   Si tiene un problema urgente y no puede comunicarse con nosotros, puede optar por buscar atencin mdica  en el consultorio de su doctor(a), en una clnica privada, en un centro de atencin urgente o en una sala de emergencias.  Si tiene una emergencia mdica, por favor llame inmediatamente al 911 o vaya a la sala de emergencias.  Nmeros de bper  - Dr. Kowalski: [phone]  - Dra. Moye: [phone]  - Dra. Stewart: [phone]  En caso de inclemencias del tiempo, por favor llame a nuestra lnea principal al [phone] para una actualizacin sobre el estado de cualquier retraso o cierre.  Consejos para la medicacin en dermatologa: Por favor, guarde las cajas en las que vienen los medicamentos de uso tpico para ayudarle a seguir las instrucciones sobre dnde y cmo usarlos. Las farmacias generalmente imprimen las instrucciones del medicamento slo en las cajas y   no directamente en los tubos del medicamento.   Si su medicamento es muy caro, por favor, pngase en contacto con nuestra oficina llamando al [phone] y presione la opcin 4 o envenos un mensaje a travs de MyChart.   No podemos decirle cul ser su copago por los medicamentos por adelantado ya que esto es diferente dependiendo de la cobertura de su seguro. Sin embargo, es posible que podamos encontrar un medicamento sustituto a menor costo o llenar un formulario para que el seguro cubra el medicamento que se considera necesario.   Si se requiere una autorizacin previa para que su compaa de seguros cubra su medicamento, por favor permtanos de 1 a 2 das hbiles para completar este proceso.  Los precios de los medicamentos varan con frecuencia dependiendo del lugar de dnde se surte la receta y alguna farmacias pueden ofrecer precios ms baratos.  El sitio web www.goodrx.com tiene cupones para medicamentos de diferentes farmacias. Los precios aqu no  tienen en cuenta lo que podra costar con la ayuda del seguro (puede ser ms barato con su seguro), pero el sitio web puede darle el precio si no utiliz ningn seguro.  - Puede imprimir el cupn correspondiente y llevarlo con su receta a la farmacia.  - Tambin puede pasar por nuestra oficina durante el horario de atencin regular y recoger una tarjeta de cupones de GoodRx.  - Si necesita que su receta se enve electrnicamente a una farmacia diferente, informe a nuestra oficina a travs de MyChart de [place] o por telfono llamando al [phone] y presione la opcin 4.  

## 2022-06-17 NOTE — Telephone Encounter (Signed)
Patient came into office today. aw

## 2022-06-17 NOTE — Telephone Encounter (Signed)
Patient called regarding area on his back that was frozen in January. Patient states the area is still red and bothersome. Patient states he constantly hits the area when rolling over in bed. He is questioning would an antibiotic ointment help the area? Should he wait until June to have this looked at again?

## 2022-06-21 ENCOUNTER — Encounter: Payer: Self-pay | Admitting: Dermatology

## 2022-06-25 ENCOUNTER — Telehealth: Payer: Self-pay

## 2022-06-25 NOTE — Telephone Encounter (Signed)
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

## 2022-06-25 NOTE — Telephone Encounter (Signed)
Patient informed of pathology results 

## 2022-08-08 ENCOUNTER — Ambulatory Visit (INDEPENDENT_AMBULATORY_CARE_PROVIDER_SITE_OTHER): Payer: Medicare Other | Admitting: Dermatology

## 2022-08-08 ENCOUNTER — Encounter: Payer: Self-pay | Admitting: Dermatology

## 2022-08-08 VITALS — BP 134/83 | HR 78

## 2022-08-08 DIAGNOSIS — L820 Inflamed seborrheic keratosis: Secondary | ICD-10-CM | POA: Diagnosis not present

## 2022-08-08 DIAGNOSIS — Z1283 Encounter for screening for malignant neoplasm of skin: Secondary | ICD-10-CM

## 2022-08-08 DIAGNOSIS — L570 Actinic keratosis: Secondary | ICD-10-CM

## 2022-08-08 DIAGNOSIS — L578 Other skin changes due to chronic exposure to nonionizing radiation: Secondary | ICD-10-CM

## 2022-08-08 DIAGNOSIS — Z85828 Personal history of other malignant neoplasm of skin: Secondary | ICD-10-CM

## 2022-08-08 DIAGNOSIS — L821 Other seborrheic keratosis: Secondary | ICD-10-CM

## 2022-08-08 DIAGNOSIS — W908XXA Exposure to other nonionizing radiation, initial encounter: Secondary | ICD-10-CM

## 2022-08-08 DIAGNOSIS — I8393 Asymptomatic varicose veins of bilateral lower extremities: Secondary | ICD-10-CM

## 2022-08-08 DIAGNOSIS — Z872 Personal history of diseases of the skin and subcutaneous tissue: Secondary | ICD-10-CM

## 2022-08-08 DIAGNOSIS — Z8589 Personal history of malignant neoplasm of other organs and systems: Secondary | ICD-10-CM

## 2022-08-08 DIAGNOSIS — D1801 Hemangioma of skin and subcutaneous tissue: Secondary | ICD-10-CM

## 2022-08-08 DIAGNOSIS — L814 Other melanin hyperpigmentation: Secondary | ICD-10-CM | POA: Diagnosis not present

## 2022-08-08 DIAGNOSIS — D229 Melanocytic nevi, unspecified: Secondary | ICD-10-CM

## 2022-08-08 NOTE — Progress Notes (Signed)
Follow-Up Visit   Subjective  Louis Cooper is a 77 y.o. male who presents for the following: Skin Cancer Screening and Full Body Skin Exam. HxSCC, HxAK. Area of concern on left dorsal hand and left knee. Pink, scaly. Painful at times.  The patient presents for Total-Body Skin Exam (TBSE) for skin cancer screening and mole check. The patient has spots, moles and lesions to be evaluated, some may be new or changing and the patient has concerns that these could be cancer.  The following portions of the chart were reviewed this encounter and updated as appropriate: medications, allergies, medical history  Review of Systems:  No other skin or systemic complaints except as noted in HPI or Assessment and Plan.  Objective  Well appearing patient in no apparent distress; mood and affect are within normal limits.  A full examination was performed including scalp, head, eyes, ears, nose, lips, neck, chest, axillae, abdomen, back, buttocks, bilateral upper extremities, bilateral lower extremities, hands, feet, fingers, toes, fingernails, and toenails. All findings within normal limits unless otherwise noted below.   Relevant physical exam findings are noted in the Assessment and Plan.  Left Hand - Dorsum, back, arms x17, left knee x1 (18) Erythematous keratotic or waxy stuck-on papule or plaque.  face x17, left neck x1 (18) Erythematous thin papules/macules with gritty scale.     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 06/17/2022 - No evidence of recurrence today - No lymphadenopathy - Recommend regular full body skin exams - Recommend daily broad spectrum sunscreen SPF 30+ to sun-exposed areas, reapply every 2 hours as needed.  - Call if any new or changing lesions are noted between office visits  LENTIGINES, SEBORRHEIC KERATOSES, HEMANGIOMAS - Benign normal skin lesions - Benign-appearing - Call for any changes  MELANOCYTIC  NEVI - Tan-brown and/or pink-flesh-colored symmetric macules and papules - Benign appearing on exam today - Observation - Call clinic for new or changing moles - Recommend daily use of broad spectrum spf 30+ sunscreen to sun-exposed areas.   ACTINIC DAMAGE - Chronic condition, secondary to cumulative UV/sun exposure - diffuse scaly erythematous macules with underlying dyspigmentation - Recommend daily broad spectrum sunscreen SPF 30+ to sun-exposed areas, reapply every 2 hours as needed.  - Staying in the shade or wearing long sleeves, sun glasses (UVA+UVB protection) and wide brim hats (4-inch brim around the entire circumference of the hat) are also recommended for sun protection.  - Call for new or changing lesions.  SKIN CANCER SCREENING PERFORMED TODAY.  Varicose Veins/Spider Veins - Dilated blue, purple or red veins at the lower extremities - Reassured - Smaller vessels can be treated by sclerotherapy (a procedure to inject a medicine into the veins to make them disappear) if desired, but the treatment is not covered by insurance. Larger vessels may be covered if symptomatic and we would refer to vascular surgeon if treatment desired. Recommend vascular surgeon consult.  Continue wearing graduated compression stockings.   Inflamed seborrheic keratosis (18) Left Hand - Dorsum, back, arms x17, left knee x1  Symptomatic, irritating, patient would like treated.  Destruction of lesion - Left Hand - Dorsum, back, arms x17, left knee x1 Complexity: simple   Destruction method: cryotherapy   Informed consent: discussed and consent obtained   Timeout:  patient name, date of birth, surgical site, and procedure verified Lesion destroyed using liquid nitrogen: Yes   Region frozen until ice ball extended beyond lesion: Yes  Outcome: patient tolerated procedure well with no complications   Post-procedure details: wound care instructions given   Additional details:  Prior to procedure,  discussed risks of blister formation, small wound, skin dyspigmentation, or rare scar following cryotherapy. Recommend Vaseline ointment to treated areas while healing.   AK (actinic keratosis) (18) face x17, left neck x1  Actinic keratoses are precancerous spots that appear secondary to cumulative UV radiation exposure/sun exposure over time. They are chronic with expected duration over 1 year. A portion of actinic keratoses will progress to squamous cell carcinoma of the skin. It is not possible to reliably predict which spots will progress to skin cancer and so treatment is recommended to prevent development of skin cancer.  Recommend daily broad spectrum sunscreen SPF 30+ to sun-exposed areas, reapply every 2 hours as needed.  Recommend staying in the shade or wearing long sleeves, sun glasses (UVA+UVB protection) and wide brim hats (4-inch brim around the entire circumference of the hat). Call for new or changing lesions.  Recheck left lateral neck. Bx if not resolved after 2 months.  Contact office if persistent after 2 months.  Destruction of lesion - face x17, left neck x1 Complexity: simple   Destruction method: cryotherapy   Informed consent: discussed and consent obtained   Timeout:  patient name, date of birth, surgical site, and procedure verified Lesion destroyed using liquid nitrogen: Yes   Region frozen until ice ball extended beyond lesion: Yes   Outcome: patient tolerated procedure well with no complications   Post-procedure details: wound care instructions given   Additional details:  Prior to procedure, discussed risks of blister formation, small wound, skin dyspigmentation, or rare scar following cryotherapy. Recommend Vaseline ointment to treated areas while healing.    Return in about 1 year (around 08/08/2023) for TBSE, HxSCC.  I, Lawson Radar, CMA, am acting as scribe for Armida Sans, MD.  Documentation: I have reviewed the above documentation for accuracy and  completeness, and I agree with the above.  Armida Sans, MD

## 2022-08-08 NOTE — Patient Instructions (Signed)
Cryotherapy Aftercare  Wash gently with soap and water everyday.   Apply Vaseline Jelly daily until healed.    Recommend daily broad spectrum sunscreen SPF 30+ to sun-exposed areas, reapply every 2 hours as needed. Call for new or changing lesions.  Staying in the shade or wearing long sleeves, sun glasses (UVA+UVB protection) and wide brim hats (4-inch brim around the entire circumference of the hat) are also recommended for sun protection.    Melanoma ABCDEs  Melanoma is the most dangerous type of skin cancer, and is the leading cause of death from skin disease.  You are more likely to develop melanoma if you: Have light-colored skin, light-colored eyes, or red or blond hair Spend a lot of time in the sun Tan regularly, either outdoors or in a tanning bed Have had blistering sunburns, especially during childhood Have a close family member who has had a melanoma Have atypical moles or large birthmarks  Early detection of melanoma is key since treatment is typically straightforward and cure rates are extremely high if we catch it early.   The first sign of melanoma is often a change in a mole or a new dark spot.  The ABCDE system is a way of remembering the signs of melanoma.  A for asymmetry:  The two halves do not match. B for border:  The edges of the growth are irregular. C for color:  A mixture of colors are present instead of an even brown color. D for diameter:  Melanomas are usually (but not always) greater than 6mm - the size of a pencil eraser. E for evolution:  The spot keeps changing in size, shape, and color.  Please check your skin once per month between visits. You can use a small mirror in front and a large mirror behind you to keep an eye on the back side or your body.   If you see any new or changing lesions before your next follow-up, please call to schedule a visit.  Please continue daily skin protection including broad spectrum sunscreen SPF 30+ to sun-exposed  areas, reapplying every 2 hours as needed when you're outdoors.   Staying in the shade or wearing long sleeves, sun glasses (UVA+UVB protection) and wide brim hats (4-inch brim around the entire circumference of the hat) are also recommended for sun protection.    Due to recent changes in healthcare laws, you may see results of your pathology and/or laboratory studies on MyChart before the doctors have had a chance to review them. We understand that in some cases there may be results that are confusing or concerning to you. Please understand that not all results are received at the same time and often the doctors may need to interpret multiple results in order to provide you with the best plan of care or course of treatment. Therefore, we ask that you please give us 2 business days to thoroughly review all your results before contacting the office for clarification. Should we see a critical lab result, you will be contacted sooner.   If You Need Anything After Your Visit  If you have any questions or concerns for your doctor, please call our main line at [phone] and press option 4 to reach your doctor's medical assistant. If no one answers, please leave a voicemail as directed and we will return your call as soon as possible. Messages left after 4 pm will be answered the following business day.   You may also send us a message via MyChart.   We typically respond to MyChart messages within 1-2 business days.  For prescription refills, please ask your pharmacy to contact our office. Our fax number is [phone].  If you have an urgent issue when the clinic is closed that cannot wait until the next business day, you can page your doctor at the number below.    Please note that while we do our best to be available for urgent issues outside of office hours, we are not available 24/7.   If you have an urgent issue and are unable to reach us, you may choose to seek medical care at your doctor's  office, retail clinic, urgent care center, or emergency room.  If you have a medical emergency, please immediately call 911 or go to the emergency department.  Pager Numbers  - Dr. Kowalski: [phone]  - Dr. Moye: [phone]  - Dr. Stewart: [phone]  In the event of inclement weather, please call our main line at [phone] for an update on the status of any delays or closures.  Dermatology Medication Tips: Please keep the boxes that topical medications come in in order to help keep track of the instructions about where and how to use these. Pharmacies typically print the medication instructions only on the boxes and not directly on the medication tubes.   If your medication is too expensive, please contact our office at [phone] option 4 or send us a message through MyChart.   We are unable to tell what your co-pay for medications will be in advance as this is different depending on your insurance coverage. However, we may be able to find a substitute medication at lower cost or fill out paperwork to get insurance to cover a needed medication.   If a prior authorization is required to get your medication covered by your insurance company, please allow us 1-2 business days to complete this process.  Drug prices often vary depending on where the prescription is filled and some pharmacies may offer cheaper prices.  The website www.goodrx.com contains coupons for medications through different pharmacies. The prices here do not account for what the cost may be with help from insurance (it may be cheaper with your insurance), but the website can give you the price if you did not use any insurance.  - You can print the associated coupon and take it with your prescription to the pharmacy.  - You may also stop by our office during regular business hours and pick up a GoodRx coupon card.  - If you need your prescription sent electronically to a different pharmacy, notify our office  through [facility] MyChart or by phone at [phone] option 4.     Si Usted Necesita Algo Despus de Su Visita  Tambin puede enviarnos un mensaje a travs de MyChart. Por lo general respondemos a los mensajes de MyChart en el transcurso de 1 a 2 das hbiles.  Para renovar recetas, por favor pida a su farmacia que se ponga en contacto con nuestra oficina. Nuestro nmero de fax es el [phone].  Si tiene un asunto urgente cuando la clnica est cerrada y que no puede esperar hasta el siguiente da hbil, puede llamar/localizar a su doctor(a) al nmero que aparece a continuacin.   Por favor, tenga en cuenta que aunque hacemos todo lo posible para estar disponibles para asuntos urgentes fuera del horario de oficina, no estamos disponibles las 24 horas del da, los 7 das de la semana.   Si tiene un problema urgente y no puede   comunicarse con nosotros, puede optar por buscar atencin mdica  en el consultorio de su doctor(a), en una clnica privada, en un centro de atencin urgente o en una sala de emergencias.  Si tiene una emergencia mdica, por favor llame inmediatamente al 911 o vaya a la sala de emergencias.  Nmeros de bper  - Dr. Kowalski: [phone]  - Dra. Moye: [phone]  - Dra. Stewart: [phone]  En caso de inclemencias del tiempo, por favor llame a nuestra lnea principal al [phone] para una actualizacin sobre el estado de cualquier retraso o cierre.  Consejos para la medicacin en dermatologa: Por favor, guarde las cajas en las que vienen los medicamentos de uso tpico para ayudarle a seguir las instrucciones sobre dnde y cmo usarlos. Las farmacias generalmente imprimen las instrucciones del medicamento slo en las cajas y no directamente en los tubos del medicamento.   Si su medicamento es muy caro, por favor, pngase en contacto con nuestra oficina llamando al [phone] y presione la opcin 4 o envenos un mensaje a travs de MyChart.   No  podemos decirle cul ser su copago por los medicamentos por adelantado ya que esto es diferente dependiendo de la cobertura de su seguro. Sin embargo, es posible que podamos encontrar un medicamento sustituto a menor costo o llenar un formulario para que el seguro cubra el medicamento que se considera necesario.   Si se requiere una autorizacin previa para que su compaa de seguros cubra su medicamento, por favor permtanos de 1 a 2 das hbiles para completar este proceso.  Los precios de los medicamentos varan con frecuencia dependiendo del lugar de dnde se surte la receta y alguna farmacias pueden ofrecer precios ms baratos.  El sitio web www.goodrx.com tiene cupones para medicamentos de diferentes farmacias. Los precios aqu no tienen en cuenta lo que podra costar con la ayuda del seguro (puede ser ms barato con su seguro), pero el sitio web puede darle el precio si no utiliz ningn seguro.  - Puede imprimir el cupn correspondiente y llevarlo con su receta a la farmacia.  - Tambin puede pasar por nuestra oficina durante el horario de atencin regular y recoger una tarjeta de cupones de GoodRx.  - Si necesita que su receta se enve electrnicamente a una farmacia diferente, informe a nuestra oficina a travs de MyChart de [facility] o por telfono llamando al [phone] y presione la opcin 4.  

## 2022-08-11 ENCOUNTER — Encounter: Payer: Self-pay | Admitting: Dermatology

## 2022-12-10 MED ORDER — SODIUM CHLORIDE 0.9 % IV SOLN: INTRAVENOUS | Status: DC

## 2022-12-11 ENCOUNTER — Other Ambulatory Visit: Payer: Self-pay

## 2022-12-11 ENCOUNTER — Encounter: Payer: Self-pay | Admitting: Internal Medicine

## 2022-12-11 ENCOUNTER — Ambulatory Visit
Admission: RE | Admit: 2022-12-11 | Discharge: 2022-12-11 | Disposition: A | Discharge status: Home / Self Care | Payer: Medicare Other | Attending: Internal Medicine | Admitting: Internal Medicine

## 2022-12-11 ENCOUNTER — Encounter
Admission: RE | Disposition: A | Discharge status: Home / Self Care | Payer: Self-pay | Source: Home / Self Care | Attending: Internal Medicine

## 2022-12-11 ENCOUNTER — Encounter: Payer: Self-pay | Admitting: Anesthesiology

## 2022-12-11 DIAGNOSIS — I4811 Longstanding persistent atrial fibrillation: Secondary | ICD-10-CM | POA: Insufficient documentation

## 2022-12-11 DIAGNOSIS — Z79899 Other long term (current) drug therapy: Secondary | ICD-10-CM | POA: Diagnosis not present

## 2022-12-11 DIAGNOSIS — I484 Atypical atrial flutter: Secondary | ICD-10-CM | POA: Insufficient documentation

## 2022-12-11 DIAGNOSIS — I471 Supraventricular tachycardia, unspecified: Secondary | ICD-10-CM | POA: Insufficient documentation

## 2022-12-11 DIAGNOSIS — I340 Nonrheumatic mitral (valve) insufficiency: Secondary | ICD-10-CM | POA: Diagnosis not present

## 2022-12-11 DIAGNOSIS — R590 Localized enlarged lymph nodes: Secondary | ICD-10-CM | POA: Diagnosis not present

## 2022-12-11 DIAGNOSIS — I700 Atherosclerosis of aorta: Secondary | ICD-10-CM | POA: Diagnosis not present

## 2022-12-11 DIAGNOSIS — Z952 Presence of prosthetic heart valve: Secondary | ICD-10-CM | POA: Diagnosis not present

## 2022-12-11 DIAGNOSIS — I447 Left bundle-branch block, unspecified: Secondary | ICD-10-CM | POA: Insufficient documentation

## 2022-12-11 DIAGNOSIS — I472 Ventricular tachycardia, unspecified: Secondary | ICD-10-CM | POA: Insufficient documentation

## 2022-12-11 DIAGNOSIS — I4819 Other persistent atrial fibrillation: Secondary | ICD-10-CM

## 2022-12-11 DIAGNOSIS — Z7901 Long term (current) use of anticoagulants: Secondary | ICD-10-CM | POA: Diagnosis not present

## 2022-12-11 HISTORY — PX: CARDIOVERSION: SHX1299

## 2022-12-11 LAB — GLUCOSE, CAPILLARY: Glucose-Capillary: 159 mg/dL — ABNORMAL HIGH (ref 70–99)

## 2022-12-11 SURGERY — CARDIOVERSION
Anesthesia: General

## 2022-12-11 NOTE — H&P (Signed)
Patient Care Team  Relationship Specialty Notifications Start End  Sandie Ano, MD PCP - General Family Medicine 10/24/15  Address: [address] [facility] [place] [address]  Mirian Capuchin, MD Cardiovascular Disease Electrocardiogram and rhythm strip (today): [facility] interpretation follows:]  None today -11/28/2022 "NSR 83 QRS 98" actually 2/1 AT ~364 msec see below image  --11/27/2022: Atrial fibrillation with rapid ventricular response 133 bpm; qrsd 96 -11/27/2022 regular 166 bpm LBBB -likely 1/1 AT see above and image below LAST NSR in ECGs was May 09 2016; cardioverted after TEE 04/02/2016 IMPRESSION:   Atypical atrial flutter, possibly clockwise CTI flutter with intermittent one-to-one conduction (heart rate 166 see ECG at bottom of note). Note ECG on 11/28/2022 read as sinus rhythm 83 bpm actually is 2-1 atrial flutter. Even though has had LSPers AFib now in this AFL his rate control is very problematic and he's very symptomatic so recommend DCCV. If recurs -ablation of AFL; if unsuccessful then PM/AVJ ablation (not recommending that 1st b/c recent endocarditis and risk of device infxn).  Longstanding persistent atrial fibrillation: I reviewed all of his electrocardiograms and the most recent one with sinus rhythm is March 2018 after cardioversion. Amiodarone stopped November 2023  Episodes of extreme tachycardia with wide QRS tachycardia, left bundle branch aberrancy rather than VT; due to one-to-one atrial flutter; see tracing at bottom. Recent discharge summary 11/28/2022 alludes to nonsustained VT but I believe this is wide-complex tachycardia due to aberrancy due to 8 one-to-one atrial flutter. 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 Recent Strep  endocarditis GI bleed Sep 2024 And see prob list  RECOMMENDATIONS and PLAN:   DCCV -do not stop Eliquis before or after Hold metoprolol on morning of cardioversion  If AFL/AT recurs (rapid heart rates) - then recommend ablation of it 3rd line option PM and AVN ablation (PM implant in setting of recent endocarditis is not ideal)  DEMOGRAPHIC INFORMATION   Patient Profile: Louis Cooper is a 77 y.o. male The patient resides in [place] in the county of [place]   HISTORY:  Chief Complaint: he seen for evaluation of rapid heart rates; afib  HPI: heart pounding ; HR 50s at times, often 80s; if does anything like stand up goes much faster -felt great after gib and transfusion in sep -was able to walk around house and yard w/o problem until last 2wk when very rapid rates. No syncope but presyncope -resolves w sitting down.  11/29/2022 "pt called to inform that when he stands up his HR goes 185"  Family history of arrhythmias, sudden death, implanted devices or inherited CV disease:  --- Other critical historical data relating to future EP Procedures:  Are there any prior anesthesia-related complications --OR prior surgical complications such as excessive bleeding or infection? no  Are there any contraindications to MRI or CTA or TEE? No; creat 1.0 11/28/22  Potential need for stress dose steroids (e.g., prolonged recent prednisone etc): OR recent/ongoing immunosuppressive therapy: no Review of prior external notes and results:  11/27/2022 Patient had one episode of palpitations yesterday AM. HR 160s on ED arrival. S/p IV metoprolol 5 mg. HR improved to 80s post Metoprolol. Unclear trigger. Telemetry with NSVT, SVT, Afib, and NSR. Started on fractionated metoprolol and transitioned to home metoprolol succinate. Continue home apixaban. Started metoprolol tartrate 25mg   Q6 PRN for HR >160 and palpitations. Holter applied at discharge to assess arrhythmia burden  11/27/2022 chest ct  pe 1. No evidence of pulmonary embolism. 2. Increased bilateral interlobular septal thickening and groundglass opacity. While this could represent asymmetric pulmonary edema or infection, mucinous adenocarcinoma could appear similar. There is also mediastinal and hilar lymphadenopathy. Consider pulmonary evaluation.  10/25/2022 GI Bleed   6/25-08/23/2022: Admitted for sepsis secondary to SSTI of R third toe and strep viridans bacteremia.  Came in with sepsis 2/2 SSTI of R third toe and found to have strep mitis/oralis bacteremia. Treated with IV CTX for 14 day course (continued on discharge), followed by PO course of penicillin VK 500 mg QID after completion of CTX for 14 days. ID will follow as an outpatient. Evaluated by vascular surgery and found to have mild atherosclerotic disease in bilateral lower extremities on CTA. Vascular surgery felt there was no acute indication for amputation or vascular intervention and will follow-up as an outpatient.   08/20/22 TEE 1. No evidence of endocarditis. 2. Well-seated prosthetic aortic valve without evidence of paravalvular leak, dehiscence, or abscess 3. Mild-mooderate mitral regurgitation (ERO=0.22cm2, VC = 0.30cm) 4. No LA appendage or LA/RA mass or thrombus 5. Normal IAS without interatrial flow connection by color doppler 6. Normal LV systolic function, mild RV systolic function 7. Moderate aortic atherosclerosis 8. Tachycardic throughout exam  08/15/22 ECHO Normal LV fxn mild LVH  Mild MR mild TR Bioprost AoV  K Thomas Feb 2024 "mildly symptomatic persistent atrial fibrillation  We discussed the treatment goals for atrial fibrillation including management of stroke risk reduction and pursuing a strategy of rate control or rhythm control. We discussed treatment options including rhythm versus rate control. He seems to be reasonably well rate controlled and doing better off the amiodarone. At this time after discussing the pros and cons risk and  benefits of the treatment options he would like to continue on with his current approach of rate control. Should he develop further symptoms we can revisit rhythm control strategies including catheter ablation. Given the chronicity of the disease and his left atrial size the success rates would be fairly marginal. Current rhythm - atrial flutter Current anti-arrhythmic - none - Medication changes - no change Current rate-control medication - metoprolol - Medication changes - no change Monitoring strategy - Close clinical follow-up   12/13/2021 NM Myo perfusion Afib, LBBB, normal myocardial perfusion w/o evidence of ischemia  Medications:   Current Outpatient Medications  Medication Sig Dispense Refill  acetaminophen (TYLENOL) 500 MG tablet Take 500 mg by mouth every 6 (six) hours as needed for Fever or Pain.  apixaban (ELIQUIS) 5 mg tablet Take 1 tablet (5 mg total) by mouth 2 (two) times daily 180 tablet 3  atorvastatin (LIPITOR) 40 MG tablet Take 1 tablet (40 mg total) by mouth once daily 90 tablet 3  azelastine (ASTELIN) 137 mcg nasal spray Place 1 spray into both nostrils 2 (two) times daily as needed for Rhinitis  cholecalciferol (VITAMIN D3) 1000 unit tablet Take 1,000 Units by mouth once daily.  FUROsemide (LASIX) 40 MG tablet TAKE 1 TABLET(40 MG) BY MOUTH EVERY DAY 90 tablet 3  losartan (COZAAR) 25 MG tablet Take 1 tablet (25 mg total) by mouth once daily Do not start taking again until you discuss with your primary care Cooper. 90 tablet 3  metoprolol succinate (TOPROL-XL) 100 MG XL tablet Take 1 tablet (100 mg total) by mouth once daily 90 tablet 3  metoprolol tartrate (LOPRESSOR) 25  MG tablet Take 1 tablet (25 mg total) by mouth every 6 (six) hours as needed (for HR> 160 and palpitations) 30 tablet 0  omeprazole (PRILOSEC) 40 MG DR capsule Take 1 capsule (40 mg total) by mouth 2 (two) times daily before meals 60 capsule 11  OXYGEN-AIR DELIVERY SYSTEMS MISC Use 1 L as needed (When  desaturating) During pneumonia recovery  tamsulosin (FLOMAX) 0.4 mg capsule TAKE 1 CAPSULE(0.4 MG) BY MOUTH EVERY DAY 30 MINUTES AFTER THE SAME MEAL (Patient taking differently: Take 0.4 mg by mouth once daily) 30 capsule 11   No current facility-administered medications for this visit.   EXAMINATION:  Vital Signs: Vitals:  12/04/22 1341  BP: 120/60  Pulse: (!) 120  Resp: 16  SpO2: 95%  Weight: 88.5 kg (195 lb)  Height: 182.9 cm (6')   Body mass index is 26.45 kg/m. Crackles r>l base Cv irreg irreg 2/6 sem

## 2022-12-11 NOTE — Consult Note (Deleted)
Patient Care Team  Relationship Specialty Notifications Start End  Sandie Ano, MD PCP - General Family Medicine 10/24/15  Address: [address] [facility] [place] [address]  Mirian Capuchin, MD Cardiovascular Disease Electrocardiogram and rhythm strip (today): [facility] interpretation follows:]  None today -11/28/2022 "NSR 83 QRS 98" actually 2/1 AT ~364 msec see below image  --11/27/2022: Atrial fibrillation with rapid ventricular response 133 bpm; qrsd 96 -11/27/2022 regular 166 bpm LBBB -likely 1/1 AT see above and image below LAST NSR in ECGs was May 09 2016; cardioverted after TEE 04/02/2016 IMPRESSION:   Atypical atrial flutter, possibly clockwise CTI flutter with intermittent one-to-one conduction (heart rate 166 see ECG at bottom of note). Note ECG on 11/28/2022 read as sinus rhythm 83 bpm actually is 2-1 atrial flutter. Even though has had LSPers AFib now in this AFL his rate control is very problematic and he's very symptomatic so recommend DCCV. If recurs -ablation of AFL; if unsuccessful then PM/AVJ ablation (not recommending that 1st b/c recent endocarditis and risk of device infxn).  Longstanding persistent atrial fibrillation: I reviewed all of his electrocardiograms and the most recent one with sinus rhythm is March 2018 after cardioversion. Amiodarone stopped November 2023  Episodes of extreme tachycardia with wide QRS tachycardia, left bundle branch aberrancy rather than VT; due to one-to-one atrial flutter; see tracing at bottom. Recent discharge summary 11/28/2022 alludes to nonsustained VT but I believe this is wide-complex tachycardia due to aberrancy due to 8 one-to-one atrial flutter. 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 Recent Strep  endocarditis GI bleed Sep 2024 And see prob list  RECOMMENDATIONS and PLAN:   DCCV -do not stop Eliquis before or after Hold metoprolol on morning of cardioversion  If AFL/AT recurs (rapid heart rates) - then recommend ablation of it 3rd line option PM and AVN ablation (PM implant in setting of recent endocarditis is not ideal)  DEMOGRAPHIC INFORMATION   Patient Profile: Louis Cooper is a 77 y.o. male The patient resides in [place] in the county of [place]   HISTORY:  Chief Complaint: he seen for evaluation of rapid heart rates; afib  HPI: heart pounding ; HR 50s at times, often 80s; if does anything like stand up goes much faster -felt great after gib and transfusion in sep -was able to walk around house and yard w/o problem until last 2wk when very rapid rates. No syncope but presyncope -resolves w sitting down.  11/29/2022 "pt called to inform that when he stands up his HR goes 185"  Family history of arrhythmias, sudden death, implanted devices or inherited CV disease:  --- Other critical historical data relating to future EP Procedures:  Are there any prior anesthesia-related complications --OR prior surgical complications such as excessive bleeding or infection? no  Are there any contraindications to MRI or CTA or TEE? No; creat 1.0 11/28/22  Potential need for stress dose steroids (e.g., prolonged recent prednisone etc): OR recent/ongoing immunosuppressive therapy: no Review of prior external notes and results:  11/27/2022 Patient had one episode of palpitations yesterday AM. HR 160s on ED arrival. S/p IV metoprolol 5 mg. HR improved to 80s post Metoprolol. Unclear trigger. Telemetry with NSVT, SVT, Afib, and NSR. Started on fractionated metoprolol and transitioned to home metoprolol succinate. Continue home apixaban. Started metoprolol tartrate 25mg   Q6 PRN for HR >160 and palpitations. Holter applied at discharge to assess arrhythmia burden  11/27/2022 chest ct  pe 1. No evidence of pulmonary embolism. 2. Increased bilateral interlobular septal thickening and groundglass opacity. While this could represent asymmetric pulmonary edema or infection, mucinous adenocarcinoma could appear similar. There is also mediastinal and hilar lymphadenopathy. Consider pulmonary evaluation.  10/25/2022 GI Bleed   6/25-08/23/2022: Admitted for sepsis secondary to SSTI of R third toe and strep viridans bacteremia.  Came in with sepsis 2/2 SSTI of R third toe and found to have strep mitis/oralis bacteremia. Treated with IV CTX for 14 day course (continued on discharge), followed by PO course of penicillin VK 500 mg QID after completion of CTX for 14 days. ID will follow as an outpatient. Evaluated by vascular surgery and found to have mild atherosclerotic disease in bilateral lower extremities on CTA. Vascular surgery felt there was no acute indication for amputation or vascular intervention and will follow-up as an outpatient.   08/20/22 TEE 1. No evidence of endocarditis. 2. Well-seated prosthetic aortic valve without evidence of paravalvular leak, dehiscence, or abscess 3. Mild-mooderate mitral regurgitation (ERO=0.22cm2, VC = 0.30cm) 4. No LA appendage or LA/RA mass or thrombus 5. Normal IAS without interatrial flow connection by color doppler 6. Normal LV systolic function, mild RV systolic function 7. Moderate aortic atherosclerosis 8. Tachycardic throughout exam  08/15/22 ECHO Normal LV fxn mild LVH  Mild MR mild TR Bioprost AoV  K Thomas Feb 2024 "mildly symptomatic persistent atrial fibrillation  We discussed the treatment goals for atrial fibrillation including management of stroke risk reduction and pursuing a strategy of rate control or rhythm control. We discussed treatment options including rhythm versus rate control. He seems to be reasonably well rate controlled and doing better off the amiodarone. At this time after discussing the pros and cons risk and  benefits of the treatment options he would like to continue on with his current approach of rate control. Should he develop further symptoms we can revisit rhythm control strategies including catheter ablation. Given the chronicity of the disease and his left atrial size the success rates would be fairly marginal. Current rhythm - atrial flutter Current anti-arrhythmic - none - Medication changes - no change Current rate-control medication - metoprolol - Medication changes - no change Monitoring strategy - Close clinical follow-up   12/13/2021 NM Myo perfusion Afib, LBBB, normal myocardial perfusion w/o evidence of ischemia  Medications:   Current Outpatient Medications  Medication Sig Dispense Refill  acetaminophen (TYLENOL) 500 MG tablet Take 500 mg by mouth every 6 (six) hours as needed for Fever or Pain.  apixaban (ELIQUIS) 5 mg tablet Take 1 tablet (5 mg total) by mouth 2 (two) times daily 180 tablet 3  atorvastatin (LIPITOR) 40 MG tablet Take 1 tablet (40 mg total) by mouth once daily 90 tablet 3  azelastine (ASTELIN) 137 mcg nasal spray Place 1 spray into both nostrils 2 (two) times daily as needed for Rhinitis  cholecalciferol (VITAMIN D3) 1000 unit tablet Take 1,000 Units by mouth once daily.  FUROsemide (LASIX) 40 MG tablet TAKE 1 TABLET(40 MG) BY MOUTH EVERY DAY 90 tablet 3  losartan (COZAAR) 25 MG tablet Take 1 tablet (25 mg total) by mouth once daily Do not start taking again until you discuss with your primary care Cooper. 90 tablet 3  metoprolol succinate (TOPROL-XL) 100 MG XL tablet Take 1 tablet (100 mg total) by mouth once daily 90 tablet 3  metoprolol tartrate (LOPRESSOR) 25  MG tablet Take 1 tablet (25 mg total) by mouth every 6 (six) hours as needed (for HR> 160 and palpitations) 30 tablet 0  omeprazole (PRILOSEC) 40 MG DR capsule Take 1 capsule (40 mg total) by mouth 2 (two) times daily before meals 60 capsule 11  OXYGEN-AIR DELIVERY SYSTEMS MISC Use 1 L as needed (When  desaturating) During pneumonia recovery  tamsulosin (FLOMAX) 0.4 mg capsule TAKE 1 CAPSULE(0.4 MG) BY MOUTH EVERY DAY 30 MINUTES AFTER THE SAME MEAL (Patient taking differently: Take 0.4 mg by mouth once daily) 30 capsule 11   No current facility-administered medications for this visit.   EXAMINATION:  Vital Signs: Vitals:  12/04/22 1341  BP: 120/60  Pulse: (!) 120  Resp: 16  SpO2: 95%  Weight: 88.5 kg (195 lb)  Height: 182.9 cm (6')   Body mass index is 26.45 kg/m. Crackles r>l base Cv irreg irreg 2/6 sem

## 2022-12-12 ENCOUNTER — Encounter: Payer: Self-pay | Admitting: Internal Medicine

## 2023-02-17 MED ORDER — SODIUM CHLORIDE 0.9 % IV SOLN: INTRAVENOUS | Status: DC

## 2023-02-18 ENCOUNTER — Encounter
Admission: RE | Disposition: A | Discharge status: Home / Self Care | Payer: Self-pay | Source: Home / Self Care | Attending: Internal Medicine

## 2023-02-18 ENCOUNTER — Encounter: Payer: Self-pay | Admitting: Internal Medicine

## 2023-02-18 ENCOUNTER — Other Ambulatory Visit: Payer: Self-pay

## 2023-02-18 ENCOUNTER — Ambulatory Visit
Admission: RE | Admit: 2023-02-18 | Discharge: 2023-02-18 | Disposition: A | Discharge status: Home / Self Care | Payer: Medicare Other | Attending: Internal Medicine | Admitting: Internal Medicine

## 2023-02-18 ENCOUNTER — Ambulatory Visit: Payer: Medicare Other | Admitting: Anesthesiology

## 2023-02-18 DIAGNOSIS — E669 Obesity, unspecified: Secondary | ICD-10-CM | POA: Diagnosis not present

## 2023-02-18 DIAGNOSIS — I110 Hypertensive heart disease with heart failure: Secondary | ICD-10-CM | POA: Insufficient documentation

## 2023-02-18 DIAGNOSIS — Z951 Presence of aortocoronary bypass graft: Secondary | ICD-10-CM | POA: Insufficient documentation

## 2023-02-18 DIAGNOSIS — I4891 Unspecified atrial fibrillation: Secondary | ICD-10-CM | POA: Insufficient documentation

## 2023-02-18 DIAGNOSIS — E119 Type 2 diabetes mellitus without complications: Secondary | ICD-10-CM | POA: Insufficient documentation

## 2023-02-18 DIAGNOSIS — Z79899 Other long term (current) drug therapy: Secondary | ICD-10-CM | POA: Diagnosis not present

## 2023-02-18 DIAGNOSIS — E785 Hyperlipidemia, unspecified: Secondary | ICD-10-CM | POA: Insufficient documentation

## 2023-02-18 DIAGNOSIS — Z7901 Long term (current) use of anticoagulants: Secondary | ICD-10-CM | POA: Insufficient documentation

## 2023-02-18 DIAGNOSIS — I4819 Other persistent atrial fibrillation: Secondary | ICD-10-CM

## 2023-02-18 DIAGNOSIS — I509 Heart failure, unspecified: Secondary | ICD-10-CM | POA: Diagnosis not present

## 2023-02-18 DIAGNOSIS — K219 Gastro-esophageal reflux disease without esophagitis: Secondary | ICD-10-CM | POA: Diagnosis not present

## 2023-02-18 HISTORY — PX: CARDIOVERSION: SHX1299

## 2023-02-18 LAB — GLUCOSE, CAPILLARY: Glucose-Capillary: 219 mg/dL — ABNORMAL HIGH (ref 70–99)

## 2023-02-18 SURGERY — CARDIOVERSION
Anesthesia: General

## 2023-02-18 MED ORDER — PROPOFOL 10 MG/ML IV BOLUS
INTRAVENOUS | Status: DC | PRN
  Administered 2023-02-18: 50 mg via INTRAVENOUS

## 2023-02-18 MED ORDER — PROPOFOL 10 MG/ML IV BOLUS
INTRAVENOUS | Status: AC
Start: 2023-02-18 — End: ?
  Filled 2023-02-18: qty 20

## 2023-02-18 NOTE — Anesthesia Procedure Notes (Signed)
 Procedure Name: MAC Date/Time: 02/18/2023 7:17 AM  Performed by: Delores Evalene BROCKS, CRNAPre-anesthesia Checklist: Patient identified, Emergency Drugs available, Suction available and Patient being monitored Patient Re-evaluated:Patient Re-evaluated prior to induction Oxygen Delivery Method: Nasal cannula Induction Type: IV induction Placement Confirmation: positive ETCO2

## 2023-02-18 NOTE — Anesthesia Preprocedure Evaluation (Addendum)
 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 No evidence of endocarditis. 2. Well-seated prosthetic aortic valve without evidence of paravalvular leak, dehiscence, or abscess 3. Mild-mooderate mitral regurgitation (ERO=0.22cm2, VC = 0.30cm) 4. No LA appendage or LA/RA mass or thrombus 5. Normal IAS without interatrial flow connection by color doppler 6. Normal LV systolic function, mild RV systolic function 7. Moderate aortic atherosclerosis 8. Tachycardic throughout exam     Neuro/Psych negative neurological ROS  negative psych ROS   GI/Hepatic Neg liver ROS,GERD  Controlled,,  Endo/Other  negative endocrine ROS    Renal/GU negative Renal ROS  negative genitourinary   Musculoskeletal  (+) Arthritis ,    Abdominal   Peds  Hematology negative hematology ROS (+)   Anesthesia Other Findings Past Medical History: No date: A-fib (HCC) No date: Actinic keratosis No date: Arthritis No date: Cataract No date: CHF (congestive heart failure) (HCC) No date: GERD (gastroesophageal reflux disease) No date: Gout No date: Heart disease No date: Hernia of abdominal wall No date: History of heart  valve replacement 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 No date: Hyperlipidemia No date: Hyperlipidemia No date: Hypertension  Past Surgical History: 04/02/2016: CARDIOVERSION; N/A     Comment:  Procedure: CARDIOVERSION;  Surgeon: Wolm JINNY Rhyme,               MD;  Location: [facility] ORS;  Service: Cardiovascular;                Laterality: N/A; No date: HERNIA REPAIR 04/02/2016: TEE WITHOUT CARDIOVERSION; N/A     Comment:  Procedure: TRANSESOPHAGEAL ECHOCARDIOGRAM (TEE);                Surgeon: Wolm JINNY Rhyme, MD;  Location: [facility] ORS;                Service: Cardiovascular;  Laterality: N/A; No date: VALVE REPLACEMENT     Reproductive/Obstetrics negative OB ROS                             Anesthesia Physical Anesthesia Plan  ASA: 3  Anesthesia Plan: General   Post-op Pain Management:    Induction: Intravenous  PONV Risk Score and Plan: TIVA and Propofol  infusion  Airway Management Planned: Nasal Cannula  Additional Equipment:   Intra-op Plan:   Post-operative Plan:   Informed Consent: I have reviewed the patients History and Physical, chart, labs and discussed the procedure including the risks, benefits and alternatives for the proposed anesthesia with the patient or authorized representative who has indicated his/her understanding and acceptance.     Dental Advisory Given  Plan Discussed with: Anesthesiologist, CRNA and Surgeon  Anesthesia Plan Comments:  Anesthesia Quick Evaluation

## 2023-02-18 NOTE — Anesthesia Postprocedure Evaluation (Signed)
 Anesthesia Post Note  Patient: Louis Cooper  Procedure(s) Performed: CARDIOVERSION  Patient location during evaluation: Specials Recovery Anesthesia Type: General Level of consciousness: awake and alert Pain management: pain level controlled Vital Signs Assessment: post-procedure vital signs reviewed and stable Respiratory status: spontaneous breathing, nonlabored ventilation and respiratory function stable Cardiovascular status: blood pressure returned to baseline and stable Postop Assessment: no apparent nausea or vomiting Anesthetic complications: no   No notable events documented.   Last Vitals:  Vitals:   02/18/23 0815 02/18/23 0830  BP: (!) 130/111 (!) 146/77  Pulse: 73 72  Resp: 18 18  Temp:    SpO2: 91% 90%    Last Pain:  Vitals:   02/18/23 0717  TempSrc: Oral  PainSc: 0-No pain                 Camellia Merilee Louder

## 2023-02-18 NOTE — Transfer of Care (Signed)
 Immediate Anesthesia Transfer of Care Note  Patient: Grayton Lobo  Procedure(s) Performed: CARDIOVERSION  Patient Location: PACU  Anesthesia Type:General  Level of Consciousness: drowsy  Airway & Oxygen Therapy: Patient Spontanous Breathing and Patient connected to nasal cannula oxygen  Post-op Assessment: Report given to RN and Post -op Vital signs reviewed and stable  Post vital signs: Reviewed and stable  Last Vitals:  Vitals Value Taken Time  BP 128/76 02/18/23 0742  Temp    Pulse 70 02/18/23 0746  Resp 18 02/18/23 0746  SpO2 96 % 02/18/23 0746  Vitals shown include unfiled device data.  Last Pain:  Vitals:   02/18/23 0717  TempSrc: Oral  PainSc: 0-No pain         Complications: No notable events documented.

## 2023-02-18 NOTE — CV Procedure (Signed)
 Electrical Cardioversion Procedure Note   Procedure: Electrical Cardioversion Indications:  Atrial Fibrillation  Procedure Details Consent: Risks of procedure as well as the alternatives and risks of each were explained to the (patient/caregiver).  Consent for procedure obtained. Time Out: Verified patient identification, verified procedure, site/side was marked, verified correct patient position, special equipment/implants available, medications/allergies/relevent history reviewed, required imaging and test results available.  Performed  Patient placed on cardiac monitor, pulse oximetry, supplemental oxygen as necessary.  Sedation given:  Propholol  Pacer pads placed anterior and posterior chest.  Cardioverted 1 time(s).  Cardioverted at 120J.  Evaluation Findings: Post procedure EKG shows: NSR Complications: None Patient did tolerate procedure well.   Cara Lovelace MD 02/18/2023

## 2023-02-21 ENCOUNTER — Other Ambulatory Visit: Payer: Self-pay | Admitting: Internal Medicine

## 2023-02-21 DIAGNOSIS — N62 Hypertrophy of breast: Secondary | ICD-10-CM

## 2023-02-21 DIAGNOSIS — N63 Unspecified lump in unspecified breast: Secondary | ICD-10-CM

## 2023-03-20 ENCOUNTER — Ambulatory Visit
Admission: RE | Admit: 2023-03-20 | Discharge: 2023-03-20 | Disposition: A | Discharge status: Home / Self Care | Payer: Medicare Other | Source: Ambulatory Visit | Attending: Internal Medicine | Admitting: Internal Medicine

## 2023-03-20 DIAGNOSIS — N62 Hypertrophy of breast: Secondary | ICD-10-CM

## 2023-03-20 DIAGNOSIS — N63 Unspecified lump in unspecified breast: Secondary | ICD-10-CM

## 2023-08-13 ENCOUNTER — Ambulatory Visit: Payer: Medicare Other | Admitting: Dermatology

## 2023-09-11 ENCOUNTER — Ambulatory Visit: Admitting: Dermatology

## 2023-09-11 ENCOUNTER — Encounter: Payer: Self-pay | Admitting: Dermatology

## 2023-09-11 DIAGNOSIS — L814 Other melanin hyperpigmentation: Secondary | ICD-10-CM

## 2023-09-11 DIAGNOSIS — Z8589 Personal history of malignant neoplasm of other organs and systems: Secondary | ICD-10-CM

## 2023-09-11 DIAGNOSIS — B029 Zoster without complications: Secondary | ICD-10-CM

## 2023-09-11 DIAGNOSIS — L578 Other skin changes due to chronic exposure to nonionizing radiation: Secondary | ICD-10-CM

## 2023-09-11 DIAGNOSIS — Z7189 Other specified counseling: Secondary | ICD-10-CM

## 2023-09-11 DIAGNOSIS — Z1283 Encounter for screening for malignant neoplasm of skin: Secondary | ICD-10-CM

## 2023-09-11 DIAGNOSIS — L570 Actinic keratosis: Secondary | ICD-10-CM

## 2023-09-11 DIAGNOSIS — D229 Melanocytic nevi, unspecified: Secondary | ICD-10-CM

## 2023-09-11 DIAGNOSIS — W908XXA Exposure to other nonionizing radiation, initial encounter: Secondary | ICD-10-CM

## 2023-09-11 DIAGNOSIS — L820 Inflamed seborrheic keratosis: Secondary | ICD-10-CM | POA: Diagnosis not present

## 2023-09-11 NOTE — Progress Notes (Signed)
 Follow-Up Visit   Subjective  Louis Cooper is a 78 y.o. male who presents for the following: Skin Cancer Screening and Full Body Skin Exam  The patient presents for Total-Body Skin Exam (TBSE) for skin cancer screening and mole check. The patient has spots, moles and lesions to be evaluated, some may be new or changing and the patient may have concern these could be cancer.  Hx SCC, AK. Patient does have some bumps at upper arms that sometimes itch.   The following portions of the chart were reviewed this encounter and updated as appropriate: medications, allergies, medical history  Review of Systems:  No other skin or systemic complaints except as noted in HPI or Assessment and Plan.  Objective  Well appearing patient in no apparent distress; mood and affect are within normal limits.  A full examination was performed including scalp, head, eyes, ears, nose, lips, neck, chest, axillae, abdomen, back, buttocks, bilateral upper extremities, bilateral lower extremities, hands, feet, fingers, toes, fingernails, and toenails. All findings within normal limits unless otherwise noted below.   Relevant physical exam findings are noted in the Assessment and Plan.  face, neck x 6 (6) Erythematous thin papules/macules with gritty scale.  arms and hands x 24, back x 2 (26) Erythematous stuck-on, waxy papule or plaque  Assessment & Plan   SKIN CANCER SCREENING PERFORMED TODAY.  ACTINIC DAMAGE - Chronic condition, secondary to cumulative UV/sun exposure - diffuse scaly erythematous macules with underlying dyspigmentation - Recommend daily broad spectrum sunscreen SPF 30+ to sun-exposed areas, reapply every 2 hours as needed.  - Staying in the shade or wearing long sleeves, sun glasses (UVA+UVB protection) and wide brim hats (4-inch brim around the entire circumference of the hat) are also recommended for sun protection.  - Call for new or changing lesions.  LENTIGINES, SEBORRHEIC  KERATOSES, HEMANGIOMAS - Benign normal skin lesions - Benign-appearing - Call for any changes  MELANOCYTIC NEVI - Tan-brown and/or pink-flesh-colored symmetric macules and papules - Benign appearing on exam today - Observation - Call clinic for new or changing moles - Recommend daily use of broad spectrum spf 30+ sunscreen to sun-exposed areas.   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 06/17/2022 - No evidence of recurrence today - No lymphadenopathy - Recommend regular full body skin exams - Recommend daily broad spectrum sunscreen SPF 30+ to sun-exposed areas, reapply every 2 hours as needed.  - Call if any new or changing lesions are noted between office visits    AK (ACTINIC KERATOSIS) (6) face, neck x 6 (6) Actinic keratoses are precancerous spots that appear secondary to cumulative UV radiation exposure/sun exposure over time. They are chronic with expected duration over 1 year. A portion of actinic keratoses will progress to squamous cell carcinoma of the skin. It is not possible to reliably predict which spots will progress to skin cancer and so treatment is recommended to prevent development of skin cancer.  Recommend daily broad spectrum sunscreen SPF 30+ to sun-exposed areas, reapply every 2 hours as needed.  Recommend staying in the shade or wearing long sleeves, sun glasses (UVA+UVB protection) and wide brim hats (4-inch brim around the entire circumference of the hat). Call for new or changing lesions. Destruction of lesion - face, neck x 6 (6) Complexity: simple   Destruction method: cryotherapy   Informed consent: discussed and consent obtained   Timeout:  patient name, date of birth, surgical site, and procedure verified Lesion destroyed using liquid nitrogen:  Yes   Region frozen until ice ball extended beyond lesion: Yes   Outcome: patient tolerated procedure well with no complications   Post-procedure details: wound care  instructions given    INFLAMED SEBORRHEIC KERATOSIS (26) arms and hands x 24, back x 2 (26) Symptomatic, irritating, patient would like treated.  Benign-appearing.  Call clinic for new or changing lesions.   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 MARLA, HxSCC, HxAK.  [name] [name], RMA, am acting as scribe for Alm Rhyme, MD .   Documentation: I have reviewed the above documentation for accuracy and completeness, and I agree with the above.  Alm Rhyme, MD

## 2023-09-11 NOTE — Patient Instructions (Signed)

## 2023-09-15 ENCOUNTER — Ambulatory Visit: Admission: RE | Admit: 2023-09-15 | Source: Home / Self Care | Admitting: Internal Medicine

## 2023-09-15 ENCOUNTER — Encounter: Admission: RE | Payer: Self-pay | Source: Home / Self Care

## 2023-09-15 DIAGNOSIS — I4819 Other persistent atrial fibrillation: Secondary | ICD-10-CM

## 2023-09-15 SURGERY — CARDIOVERSION
Anesthesia: General

## 2023-12-30 ENCOUNTER — Other Ambulatory Visit (INDEPENDENT_AMBULATORY_CARE_PROVIDER_SITE_OTHER): Payer: Self-pay | Admitting: Nurse Practitioner

## 2023-12-30 DIAGNOSIS — I739 Peripheral vascular disease, unspecified: Secondary | ICD-10-CM

## 2023-12-31 ENCOUNTER — Other Ambulatory Visit (INDEPENDENT_AMBULATORY_CARE_PROVIDER_SITE_OTHER)

## 2023-12-31 ENCOUNTER — Ambulatory Visit (INDEPENDENT_AMBULATORY_CARE_PROVIDER_SITE_OTHER): Admitting: Vascular Surgery

## 2023-12-31 ENCOUNTER — Encounter (INDEPENDENT_AMBULATORY_CARE_PROVIDER_SITE_OTHER): Payer: Self-pay | Admitting: Vascular Surgery

## 2023-12-31 VITALS — BP 122/70 | HR 90 | Resp 18 | Ht 72.0 in | Wt 198.8 lb

## 2023-12-31 DIAGNOSIS — E1169 Type 2 diabetes mellitus with other specified complication: Secondary | ICD-10-CM

## 2023-12-31 DIAGNOSIS — I10 Essential (primary) hypertension: Secondary | ICD-10-CM | POA: Diagnosis not present

## 2023-12-31 DIAGNOSIS — L97909 Non-pressure chronic ulcer of unspecified part of unspecified lower leg with unspecified severity: Secondary | ICD-10-CM

## 2023-12-31 DIAGNOSIS — I739 Peripheral vascular disease, unspecified: Secondary | ICD-10-CM | POA: Diagnosis not present

## 2023-12-31 DIAGNOSIS — E08622 Diabetes mellitus due to underlying condition with other skin ulcer: Secondary | ICD-10-CM

## 2023-12-31 DIAGNOSIS — E785 Hyperlipidemia, unspecified: Secondary | ICD-10-CM

## 2023-12-31 DIAGNOSIS — I70299 Other atherosclerosis of native arteries of extremities, unspecified extremity: Secondary | ICD-10-CM | POA: Diagnosis not present

## 2023-12-31 DIAGNOSIS — L98499 Non-pressure chronic ulcer of skin of other sites with unspecified severity: Secondary | ICD-10-CM

## 2023-12-31 NOTE — Progress Notes (Signed)
 Subjective:    Patient ID: Louis Cooper, male    DOB: [birth year], 78 y.o.   MRN: [id number] Chief Complaint  Patient presents with   Establish Care    New patient ABI + consult PVD ref. cline    Louis Cooper is a 78 yo male who presents to clinic today with chief complaint of his second, third, fourth toe on his left foot with chronic ulcerations that are not healing.  Patient was seen by Dr. Krystal Sage in podiatry who feels the patient may have some type of atherosclerotic disease thus preventing his toes from healing.  Patient endorses he feels like it is from walking in the shoes that he has.  He is grinding his toes against the bottom of the shoe that most likely is not padded off and therefore created the ulcers and they have never gone away.  He states that this started about a year ago and over the last 2 months he has developed some gangrene to that tissue on those 3 toes.  Patient was sent to us  for evaluation and underwent arterial duplex ultrasounds today.  Right ABI today is 1.09 with triphasic waveforms at the PTA and monophasic waveforms at the DP.  Today's TBI is 0.61. Left ABI is 1.14 with triphasic waveforms.  He has biphasic waveforms at the [facility].  Today's TBI is 0.79.    Review of Systems  Constitutional: Negative.   Skin:  Positive for color change and wound.       To his left 2nd, 3rd and 4th toe.  All other systems reviewed and are negative.      Objective:   Physical Exam Constitutional:      Appearance: Normal appearance. He is normal weight.  HENT:     Head: Normocephalic and atraumatic.  Eyes:     Pupils: Pupils are equal, round, and reactive to light.  Cardiovascular:     Rate and Rhythm: Normal rate. Rhythm irregular.     Pulses: Normal pulses.     Heart sounds: Normal heart sounds.  Pulmonary:     Effort: Pulmonary effort is normal.     Breath sounds: Normal breath sounds.  Abdominal:     General: Abdomen is flat. Bowel sounds are normal.      Palpations: Abdomen is soft.  Musculoskeletal:        General: Signs of injury present.     Comments: Gangrenous changes to the patient's left 2nd, 3rd and 4th toe due to grinding on his shoe.  Skin:    General: Skin is warm and dry.     Capillary Refill: Capillary refill takes more than 3 seconds. Capillary refill is slow to the patient's toes on his left foot.  All toes    Coloration: Skin is pale.  Neurological:     General: No focal deficit present.     Mental Status: He is alert and oriented to person, place, and time. Mental status is at baseline.  Psychiatric:        Mood and Affect: Mood normal.        Behavior: Behavior normal.        Thought Content: Thought content normal.        Judgment: Judgment normal.     BP 122/70 (BP Location: Right Arm, Patient Position: Sitting, Cuff Size: Normal)   Pulse 90   Resp 18   Ht 6' (1.829 m)   Wt 198 lb 12.8 oz (90.2 kg)   BMI 26.96 kg/m  Past Medical History:  Diagnosis Date   A-fib (HCC)    Actinic keratosis    Arthritis    Cataract    CHF (congestive heart failure) (HCC)    GERD (gastroesophageal reflux disease)    Gout    Heart disease    Hernia of abdominal wall    History of heart valve replacement    Hx of CABG    Hyperlipidemia    Hyperlipidemia    Hypertension    Squamous cell carcinoma of skin 06/17/2022   R upper back paraspinal, tx with ED&C    Social History   Socioeconomic History   Marital status: Married    Spouse name: Not on file   Number of children: Not on file   Years of education: Not on file   Highest education level: Not on file  Occupational History   Not on file  Tobacco Use   Smoking status: Never   Smokeless tobacco: Never  Vaping Use   Vaping status: Never Used  Substance and Sexual Activity   Alcohol use: No   Drug use: Not on file   Sexual activity: Not on file  Other Topics Concern   Not on file  Social History Narrative   Not on file   Social Drivers of Health    Financial Resource Strain: Low Risk  (12/03/2023)   Received from [facility]   Overall Financial Resource Strain (CARDIA)    Difficulty of Paying Living Expenses: Not very hard  Food Insecurity: No Food Insecurity (12/03/2023)   Received from [facility]   Hunger Vital Sign    Within the past 12 months, you worried that your food would run out before you got the money to buy more.: Never true    Within the past 12 months, the food you bought just didn't last and you didn't have money to get more.: Never true  Transportation Needs: No Transportation Needs (12/03/2023)   Received from [facility]    In the past 12 months, has lack of transportation kept you from medical appointments or from getting medications?: No    Lack of Transportation (Non-Medical): No  Physical Activity: Not on file  Stress: Not on file  Social Connections: Not on file  Intimate Partner Violence: Not on file    Past Surgical History:  Procedure Laterality Date   CARDIOVERSION N/A 04/02/2016   Procedure: CARDIOVERSION;  Surgeon: Wolm JINNY Rhyme, MD;  Location: [facility] OR[facility];  Service: Cardiovascular;  Laterality: N/A;   CARDIOVERSION N/A 01/05/2020   Procedure: CARDIOVERSION;  Surgeon: Rhyme Wolm JINNY, MD;  Location: [facility];  Service: Cardiovascular;  Laterality: N/A;   CARDIOVERSION N/A 04/18/2020   Procedure: CARDIOVERSION;  Surgeon: Rhyme Wolm JINNY, MD;  Location: [facility];  Service: Cardiovascular;  Laterality: N/A;   CARDIOVERSION N/A 12/11/2022   Procedure: CARDIOVERSION;  Surgeon: Dewane Shiner, DO;  Location: [facility];  Service: Cardiovascular;  Laterality: N/A;   CARDIOVERSION N/A 02/18/2023   Procedure: CARDIOVERSION;  Surgeon: Florencio Cara BIRCH, MD;  Location: [facility];  Service: Cardiovascular;  Laterality: N/A;   HERNIA REPAIR     TEE WITHOUT CARDIOVERSION N/A 04/02/2016   Procedure: TRANSESOPHAGEAL  ECHOCARDIOGRAM (TEE);  Surgeon: Wolm JINNY Rhyme, MD;  Location: [facility];  Service: Cardiovascular;  Laterality: N/A;   VALVE REPLACEMENT      History reviewed. No pertinent family history.  Allergies  Allergen Reactions   Multaq [Dronedarone]  Dehydrated / dizziness        Latest Ref Rng & Units 09/09/2019   12:45 PM 04/25/2016   12:00 PM  CBC  WBC 4.0 - 10.5 K/uL 8.0  4.8   Hemoglobin 13.0 - 17.0 g/dL 86.8  86.7   Hematocrit 39.0 - 52.0 % 39.9  38.0   Platelets 150 - 400 K/uL 119  156       CMP     Component Value Date/Time   NA 140 09/09/2019 1245   K 3.3 (L) 09/09/2019 1245   CL 102 09/09/2019 1245   CO2 27 09/09/2019 1245   GLUCOSE 121 (H) 09/09/2019 1245   BUN 16 09/09/2019 1245   CREATININE 0.95 09/09/2019 1245   CALCIUM  9.7 09/09/2019 1245   GFRNONAA >60 09/09/2019 1245     No results found.     Assessment & Plan:   1. 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  This represents a limb threatening ischemia and places the patient at the risk for left limb loss.  Patient should undergo angiography of the left lower extremity with the hope for intervention for limb salvage.  The risks and benefits as well as the alternative therapies was discussed in detail with the patient.  All questions were answered.  Patient agrees to proceed with left lower extremity angiography.  The patient will follow up with me in the office after the procedure.   2. Primary hypertension Continue antihypertensive medications as already ordered, these medications have been reviewed and there are no changes at this time.  3. Hyperlipidemia associated with type 2 diabetes mellitus (HCC) Continue statin as ordered and reviewed, no changes at this time  4. Diabetes mellitus due to underlying  condition with other skin ulcer, unspecified whether long term insulin use (HCC) Continue hypoglycemic medications as already ordered, these medications have been reviewed and there are no changes at this time.  Hgb A1C to be monitored as already arranged by primary service   Current Outpatient Medications on File Prior to Visit  Medication Sig Dispense Refill   acetaminophen  (TYLENOL ) 500 MG tablet Take 500 mg by mouth every 8 (eight) hours as needed for mild pain (pain score 1-3).     amiodarone (PACERONE) 200 MG tablet Take 200 mg by mouth daily.     apixaban (ELIQUIS) 5 MG TABS tablet Take 5 mg by mouth 2 (two) times daily.     atorvastatin  (LIPITOR) 40 MG tablet Take 40 mg by mouth daily.     DULoxetine (CYMBALTA) 20 MG capsule Take 20 mg by mouth daily.     gabapentin (NEURONTIN) 100 MG capsule Take 100 mg by mouth 2 (two) times daily.     losartan (COZAAR) 25 MG tablet Take 25 mg by mouth daily.     metoprolol  succinate (TOPROL -XL) 100 MG 24 hr tablet Take 100 mg by mouth daily.     metoprolol  tartrate (LOPRESSOR ) 25 MG tablet Take 25 mg by mouth 2 (two) times daily as needed (Heartrate over 160).     omeprazole (PRILOSEC) 40 MG capsule Take 40 mg by mouth daily as needed (heartburn).      OXYGEN Inhale 1 L into the lungs daily as needed (Oxygen levels below 90%).     potassium chloride  (KLOR-CON ) 10 MEQ tablet Take 10 mEq by mouth daily.  silver sulfADIAZINE (SILVADENE) 1 % cream Apply 1 Application topically 3 (three) times daily.     tamsulosin  (FLOMAX ) 0.4 MG CAPS capsule Take 0.4 mg by mouth daily.     torsemide (DEMADEX) 20 MG tablet Take 40 mg by mouth daily.     No current facility-administered medications on file prior to visit.    There are no Patient Instructions on file for this visit. No follow-ups on file.   Gwendlyn JONELLE Shank, NP

## 2024-01-01 ENCOUNTER — Encounter (INDEPENDENT_AMBULATORY_CARE_PROVIDER_SITE_OTHER)

## 2024-01-01 ENCOUNTER — Encounter (INDEPENDENT_AMBULATORY_CARE_PROVIDER_SITE_OTHER): Admitting: Vascular Surgery

## 2024-01-07 ENCOUNTER — Telehealth (INDEPENDENT_AMBULATORY_CARE_PROVIDER_SITE_OTHER): Payer: Self-pay

## 2024-01-07 NOTE — Telephone Encounter (Signed)
I attempted to contact the patient to schedule a LLE angio with Dr. Delana Meyer. A message was left for a return call.

## 2024-01-08 NOTE — Telephone Encounter (Signed)
 Patient returned my call and was offered 01/27/24 for his LLE angio with a 11:00 am arrival time to the [facility]. Patient stated he thinks that will work and will call back to confirm.

## 2024-01-12 NOTE — Telephone Encounter (Signed)
 Patient left a message to be scheduled for his LLE angio with Dr. Jama on 02/03/24 with a 11:00 am arrival time to the [facility]. Pre-procedure instructions will be mailed.

## 2024-01-14 NOTE — Telephone Encounter (Signed)
 Patient called at this time and wanted a confirmation of his procedure date and what time to arrive at the hospital, he verbalized understanding the procedure is Tuesday 02/03/24 and his arrival time is 11AM.

## 2024-01-27 ENCOUNTER — Ambulatory Visit: Admit: 2024-01-27 | Admitting: Vascular Surgery

## 2024-01-27 DIAGNOSIS — L97909 Non-pressure chronic ulcer of unspecified part of unspecified lower leg with unspecified severity: Secondary | ICD-10-CM

## 2024-01-27 SURGERY — LOWER EXTREMITY ANGIOGRAPHY
Anesthesia: Moderate Sedation | Site: Leg Lower | Laterality: Left

## 2024-02-03 ENCOUNTER — Ambulatory Visit: Admission: RE | Admit: 2024-02-03 | Admitting: Vascular Surgery

## 2024-02-03 ENCOUNTER — Encounter: Admission: RE | Payer: Self-pay | Source: Home / Self Care

## 2024-02-03 DIAGNOSIS — L97909 Non-pressure chronic ulcer of unspecified part of unspecified lower leg with unspecified severity: Secondary | ICD-10-CM

## 2024-02-03 SURGERY — LOWER EXTREMITY ANGIOGRAPHY
Anesthesia: Moderate Sedation | Site: Leg Lower | Laterality: Left

## 2024-02-27 ENCOUNTER — Telehealth (INDEPENDENT_AMBULATORY_CARE_PROVIDER_SITE_OTHER): Payer: Self-pay

## 2024-02-27 NOTE — Telephone Encounter (Signed)
 I called to get the patient rescheduled for a LLE angi with Dr. Jama. Patient stated he still had pneumonia and would call back to reschedule when I was better.

## 2024-03-11 ENCOUNTER — Telehealth (INDEPENDENT_AMBULATORY_CARE_PROVIDER_SITE_OTHER): Payer: Self-pay

## 2024-03-11 NOTE — Telephone Encounter (Signed)
 I attempted to contact the patient's daughter to get the patient rescheduled for a LLE angio. A message was left for a return call.

## 2024-09-16 ENCOUNTER — Ambulatory Visit: Admitting: Dermatology
